# Patient Record
Sex: Female | Born: 1970 | Race: White | Hispanic: No | Marital: Single | Smoking: Former smoker
Health system: Southern US, Community
[De-identification: ages and names within clinical notes are randomized; demographics above are authoritative.]

## PROBLEM LIST (undated history)

## (undated) DIAGNOSIS — F419 Anxiety disorder, unspecified: Secondary | ICD-10-CM

## (undated) DIAGNOSIS — F32A Depression, unspecified: Secondary | ICD-10-CM

## (undated) DIAGNOSIS — Z8052 Family history of malignant neoplasm of bladder: Secondary | ICD-10-CM

## (undated) DIAGNOSIS — K219 Gastro-esophageal reflux disease without esophagitis: Secondary | ICD-10-CM

## (undated) HISTORY — DX: Depression, unspecified: F32.A

## (undated) HISTORY — DX: Family history of malignant neoplasm of bladder: Z80.52

## (undated) HISTORY — DX: Gastro-esophageal reflux disease without esophagitis: K21.9

## (undated) HISTORY — DX: Anxiety disorder, unspecified: F41.9

---

## 2000-07-16 ENCOUNTER — Other Ambulatory Visit: Admission: RE | Admit: 2000-07-16 | Discharge: 2000-07-16 | Payer: Self-pay | Admitting: Obstetrics and Gynecology

## 2001-01-08 ENCOUNTER — Inpatient Hospital Stay (HOSPITAL_COMMUNITY): Admission: AD | Admit: 2001-01-08 | Discharge: 2001-01-10 | Payer: Self-pay | Admitting: Pediatrics

## 2001-01-08 ENCOUNTER — Encounter (INDEPENDENT_AMBULATORY_CARE_PROVIDER_SITE_OTHER): Payer: Self-pay

## 2001-02-17 ENCOUNTER — Other Ambulatory Visit: Admission: RE | Admit: 2001-02-17 | Discharge: 2001-02-17 | Payer: Self-pay | Admitting: Obstetrics and Gynecology

## 2001-11-26 HISTORY — PX: TUBAL LIGATION: SHX77

## 2002-02-24 ENCOUNTER — Other Ambulatory Visit: Admission: RE | Admit: 2002-02-24 | Discharge: 2002-02-24 | Payer: Self-pay | Admitting: Obstetrics and Gynecology

## 2002-09-09 ENCOUNTER — Inpatient Hospital Stay (HOSPITAL_COMMUNITY): Admission: AD | Admit: 2002-09-09 | Discharge: 2002-09-11 | Payer: Self-pay | Admitting: Obstetrics and Gynecology

## 2002-09-09 ENCOUNTER — Encounter (INDEPENDENT_AMBULATORY_CARE_PROVIDER_SITE_OTHER): Payer: Self-pay | Admitting: *Deleted

## 2002-11-10 ENCOUNTER — Other Ambulatory Visit: Admission: RE | Admit: 2002-11-10 | Discharge: 2002-11-10 | Payer: Self-pay | Admitting: Obstetrics and Gynecology

## 2004-02-16 ENCOUNTER — Other Ambulatory Visit: Admission: RE | Admit: 2004-02-16 | Discharge: 2004-02-16 | Payer: Self-pay | Admitting: Obstetrics and Gynecology

## 2006-02-15 ENCOUNTER — Other Ambulatory Visit: Admission: RE | Admit: 2006-02-15 | Discharge: 2006-02-15 | Payer: Self-pay | Admitting: Obstetrics and Gynecology

## 2009-05-12 ENCOUNTER — Ambulatory Visit: Payer: Self-pay | Admitting: Radiology

## 2009-05-12 ENCOUNTER — Emergency Department (HOSPITAL_BASED_OUTPATIENT_CLINIC_OR_DEPARTMENT_OTHER): Admission: EM | Admit: 2009-05-12 | Discharge: 2009-05-12 | Payer: Self-pay | Admitting: Emergency Medicine

## 2010-01-13 ENCOUNTER — Ambulatory Visit: Payer: Self-pay | Admitting: Diagnostic Radiology

## 2010-01-13 ENCOUNTER — Emergency Department (HOSPITAL_BASED_OUTPATIENT_CLINIC_OR_DEPARTMENT_OTHER): Admission: EM | Admit: 2010-01-13 | Discharge: 2010-01-13 | Payer: Self-pay | Admitting: Emergency Medicine

## 2010-06-23 ENCOUNTER — Other Ambulatory Visit: Admission: RE | Admit: 2010-06-23 | Discharge: 2010-06-23 | Payer: Self-pay | Admitting: Family Medicine

## 2011-02-14 LAB — PREGNANCY, URINE: Preg Test, Ur: NEGATIVE

## 2011-04-13 NOTE — Op Note (Signed)
NAME:  Sarah [name], Sarah [name]                            ACCOUNT NO.:  [id number]   MEDICAL RECORD NO.:  [id number]                   PATIENT TYPE:  INP   LOCATION:  9133                                 FACILITY:  [facility]   PHYSICIAN:  Dineen Kid. Rana Snare, M.D.                 DATE OF BIRTH:  Jan 27, 1971   DATE OF PROCEDURE:  09/09/2002  DATE OF DISCHARGE:                                 OPERATIVE REPORT   PREOPERATIVE DIAGNOSIS:  Multiparity, desires sterility.   POSTOPERATIVE DIAGNOSIS:  Multiparity, desires sterility.   PROCEDURE:  Modified [place] bilateral tubal ligation.   SURGEON:  Dineen Kid. Rana Snare, M.D.   ANESTHESIA:  Epidural.   INDICATIONS FOR PROCEDURE:  The patient is a 40 year old G4, P2, A1, who  just delivered her third child after induction at 76 weeks' estimated  gestational age.  The pregnancy and the delivery was uncomplicated.  She and  her husband both adamantly desire sterilization and planned postpartum tubal  ligation.  Risks and benefits were discussed at length including 5 out of a  1000 failure rate.  Informed consent was obtained.   DESCRIPTION OF PROCEDURE:  After adequate analgesia, the patient was placed  in the supine position.  She was sterilely prepped and draped.  The  infraumbilical area was infiltrated with 0.25% Marcaine.  Allis clamps were  placed and a 2-cm infraumbilical skin incision was made and taken down  sharply to the fascia.  This is incised transversely.  The peritoneum was  entered sharply.  [employer] retractors were placed.  The left fallopian tube  was identified, grasped with a Babcock clamp, identified by the fimbriated  end.  The mid portion of the tube was grasped.  It was doubly ligated with 0  plain suture.  The central portion excised.  A suture of 2-0 silk was  ligated on the proximal portion of the tube and the tube was released in the  peritoneal cavity.  The right fallopian tube was identified, grasped with a  Babcock clamp, again  identified by fimbriated end.  The mid portion of the  tube was doubly ligated with two sutures of 0 plain.  Central portion  excised.  Tubal ostia is noted to be hemostatic.  The proximal section was  then ligated with 2-0 silk.  It was released back into the peritoneal  cavity.  The fascia was then closed in a running suture of 0 Vicryl.  The  skin is then closed with 3-0 Vicryl Rapide in a subcuticular fashion.  The  patient tolerated the procedure well and was stable and transferred to the  recovery room.  Sponge and instrument count was normal x3.  Estimated blood  loss was less than 10 cc.  Dineen Kid Rana Snare, M.D.    DCL/MEDQ  D:  09/09/2002  T:  09/09/2002  Job:  [id number]

## 2016-06-10 ENCOUNTER — Emergency Department (HOSPITAL_BASED_OUTPATIENT_CLINIC_OR_DEPARTMENT_OTHER)
Admission: EM | Admit: 2016-06-10 | Discharge: 2016-06-10 | Disposition: A | Discharge status: Home / Self Care | Payer: Self-pay | Attending: Emergency Medicine | Admitting: Emergency Medicine

## 2016-06-10 ENCOUNTER — Emergency Department (HOSPITAL_BASED_OUTPATIENT_CLINIC_OR_DEPARTMENT_OTHER): Payer: Self-pay

## 2016-06-10 ENCOUNTER — Encounter (HOSPITAL_BASED_OUTPATIENT_CLINIC_OR_DEPARTMENT_OTHER): Payer: Self-pay | Admitting: *Deleted

## 2016-06-10 DIAGNOSIS — W5501XA Bitten by cat, initial encounter: Secondary | ICD-10-CM | POA: Insufficient documentation

## 2016-06-10 DIAGNOSIS — F17200 Nicotine dependence, unspecified, uncomplicated: Secondary | ICD-10-CM | POA: Insufficient documentation

## 2016-06-10 DIAGNOSIS — T148XXA Other injury of unspecified body region, initial encounter: Secondary | ICD-10-CM

## 2016-06-10 DIAGNOSIS — S61235A Puncture wound without foreign body of left ring finger without damage to nail, initial encounter: Secondary | ICD-10-CM | POA: Insufficient documentation

## 2016-06-10 DIAGNOSIS — Y999 Unspecified external cause status: Secondary | ICD-10-CM | POA: Insufficient documentation

## 2016-06-10 DIAGNOSIS — Y929 Unspecified place or not applicable: Secondary | ICD-10-CM | POA: Insufficient documentation

## 2016-06-10 DIAGNOSIS — Y9389 Activity, other specified: Secondary | ICD-10-CM | POA: Insufficient documentation

## 2016-06-10 MED ORDER — AMOXICILLIN-POT CLAVULANATE 875-125 MG PO TABS: 1.0000 | ORAL_TABLET | Freq: Two times a day (BID) | ORAL | Status: DC

## 2016-06-10 MED ORDER — TETANUS-DIPHTH-ACELL PERTUSSIS 5-2.5-18.5 LF-MCG/0.5 IM SUSP
0.5000 mL | Freq: Once | INTRAMUSCULAR | Status: AC
  Administered 2016-06-10: 0.5 mL via INTRAMUSCULAR
  Filled 2016-06-10: qty 0.5

## 2016-06-10 MED ORDER — AMOXICILLIN-POT CLAVULANATE 875-125 MG PO TABS
1.0000 | ORAL_TABLET | Freq: Once | ORAL | Status: AC
  Administered 2016-06-10: 1 via ORAL
  Filled 2016-06-10: qty 1

## 2016-06-10 NOTE — Discharge Instructions (Signed)
Animal Bite Follow up with the hand doctor. Take the antibiotics as prescribed. Return to the ED with worsening pain, fever, spreading redness, unable to move finger, or any other concerns. Animal bites can range from mild to serious. An animal bite can result in a scratch on the skin, a deep open cut, a puncture of the skin, a crush injury, or tearing away of the skin or a body part. A small bite from a house pet will usually not cause serious problems. However, some animal bites can become infected or injure a bone or other tissue.  Bites from certain animals can be more dangerous because of the risk of spreading rabies, which is a serious viral infection. This risk is higher with bites from stray animals or wild animals, such as raccoons, foxes, skunks, and bats. Dogs are responsible for most animal bites. Children are bitten more often than adults. SYMPTOMS  Common symptoms of an animal bite include:   Pain.   Bleeding.   Swelling.   Bruising.  DIAGNOSIS  This condition may be diagnosed based on a physical exam and medical history. Your health care provider will examine the wound and ask for details about the animal and how the bite happened. You may also have tests, such as:   Blood tests to check for infection or to determine if surgery is needed.  X-rays to check for damage to bones or joints.  Culture test. This uses a sample of fluid from the wound to check for infection. TREATMENT  Treatment varies depending on the location and type of animal bite and your medical history. Treatment may include:   Wound care. This often includes cleaning the wound, flushing the wound with saline solution, and applying a bandage (dressing). Sometimes, the wound is left open to heal because of the high risk of infection. However, in some cases, the wound may be closed with stitches (sutures), staples, skin glue, or adhesive strips.   Antibiotic medicine.   Tetanus shot.   Rabies  treatment if the animal could have rabies.  In some cases, bites that have become infected may require IV antibiotics and surgical treatment in the hospital.  [place]  Follow instructions from your health care provider about how to take care of your wound. Make sure you:  Wash your hands with soap and water before you change your dressing. If soap and water are not available, use hand sanitizer.  Change your dressing as told by your health care provider.  Leave sutures, skin glue, or adhesive strips in place. These skin closures may need to be in place for 2 weeks or longer. If adhesive strip edges start to loosen and curl up, you may trim the loose edges. Do not remove adhesive strips completely unless your health care provider tells you to do that.  Check your wound every day for signs of infection. Watch for:   Increasing redness, swelling, or pain.   Fluid, blood, or pus.  General Instructions  Take or apply over-the-counter and prescription medicines only as told by your health care provider.   If you were prescribed an antibiotic, take or apply it as told by your health care provider. Do not stop using the antibiotic even if your condition improves.   Keep the injured area raised (elevated) above the level of your heart while you are sitting or lying down, if this is possible.   If directed, apply ice to the injured area.   Put ice in  a plastic bag.   Place a towel between your skin and the bag.   Leave the ice on for 20 minutes, 2-3 times per day.   Keep all follow-up visits as told by your health care provider. This is important.  SEEK MEDICAL CARE IF:  You have increasing redness, swelling, or pain at the site of your wound.   You have a general feeling of sickness (malaise).   You feel nauseous or you vomit.   You have pain that does not get better.  SEEK IMMEDIATE MEDICAL CARE IF:  You have a red streak  extending away from your wound.   You have fluid, blood, or pus coming from your wound.   You have a fever or chills.   You have trouble moving your injured area.   You have numbness or tingling extending beyond the wound.   This information is not intended to replace advice given to you by your health care provider. Make sure you discuss any questions you have with your health care provider.   Document Released: 07/31/2011 Document Revised: 08/03/2015 Document Reviewed: 03/30/2015 Elsevier Interactive Patient Education [employer].

## 2016-06-10 NOTE — ED Provider Notes (Signed)
CSN: [id number]     Arrival date & time 06/10/16  [id number] History   First MD Initiated Contact with Patient 06/10/16 [phone]     Chief Complaint  Patient presents with  . Animal Bite     (Consider location/radiation/quality/duration/timing/severity/associated sxs/prior Treatment) HPI Comments: Patient states she was bitten by her cat on her left ring finger while attempting to give the cat a breathing treatment. Cat Subsequently passed away from respiratory distress. Cat was 54 years old and up-to-date on immunizations. Patient does not know her last tetanus shot was. She complains of pain to her distal fourth digit. No weakness, numbness or tingling. The patient has puncture wounds to left fourth distal phalanx through the nail plate and nail bed as well as on palmar surface of distal phalanx  The history is provided by the patient.    History reviewed. No pertinent past medical history. Past Surgical History  Procedure Laterality Date  . Tubal ligation  2003   No family history on file. Social History  Substance Use Topics  . Smoking status: Current Every Day Smoker  . Smokeless tobacco: Never Used  . Alcohol Use: Yes     Comment: several times per week   OB History    No data available     Review of Systems  Constitutional: Negative for activity change, appetite change and fatigue.  HENT: Negative for congestion.   Respiratory: Negative for cough, chest tightness and shortness of breath.   Cardiovascular: Negative for chest pain.  Gastrointestinal: Negative for abdominal pain.  Genitourinary: Negative for dysuria, hematuria, vaginal bleeding and vaginal discharge.  Musculoskeletal: Positive for myalgias and arthralgias.  Skin: Positive for wound.  A complete 10 system review of systems was obtained and all systems are negative except as noted in the HPI and PMH.      Allergies  Review of patient's allergies indicates no known allergies.  Home Medications   Prior to  Admission medications   Not on File   BP 124/93 mmHg  Pulse 102  Temp(Src) 98 F (36.7 C) (Oral)  Resp 18  Ht 5\' 4"  (1.626 m)  Wt 107 lb (48.535 kg)  BMI 18.36 kg/m2  SpO2 98% Physical Exam  Constitutional: She is oriented to person, place, and time. She appears well-developed and well-nourished. No distress.  HENT:  Head: Normocephalic and atraumatic.  Mouth/Throat: Oropharynx is clear and moist. No oropharyngeal exudate.  Eyes: Conjunctivae and EOM are normal. Pupils are equal, round, and reactive to light.  Neck: Normal range of motion. Neck supple.  No meningismus.  Cardiovascular: Normal rate, regular rhythm, normal heart sounds and intact distal pulses.   No murmur heard. Pulmonary/Chest: Effort normal and breath sounds normal. No respiratory distress.  Abdominal: Soft. There is no tenderness. There is no rebound and no guarding.  Musculoskeletal: Normal range of motion. She exhibits tenderness. She exhibits no edema.  TTP L 4th distal phalanx. Puncture wound to left distal phalanx through the nail plate no significant subungual hematoma. Puncture wound through plantar surface of distal phalanx. Intact radial pulse. Intact flexion and extension of DIP and PIP joints. Intact radial pulse. Distal sensation intact.  Neurological: She is alert and oriented to person, place, and time. No cranial nerve deficit. She exhibits normal muscle tone. Coordination normal.  No ataxia on finger to nose bilaterally. No pronator drift. 5/5 strength throughout. CN 2-12 intact.Equal grip strength. Sensation intact.   Skin: Skin is warm. There is erythema.  Psychiatric: She has a normal mood  and affect. Her behavior is normal.  Nursing note and vitals reviewed.   ED Course  Procedures (including critical care time) Labs Review Labs Reviewed - No data to display  Imaging Review Dg Finger Ring Left  06/10/2016  CLINICAL DATA:  Cat bite. EXAM: LEFT RING FINGER 2+V COMPARISON:  None. FINDINGS:  There is no evidence of fracture or dislocation. There is no evidence of arthropathy or other focal bone abnormality. Soft tissues are unremarkable. IMPRESSION: Normal left fourth finger. Electronically Signed   By: Marijo Conception, M.D.   On: 06/10/2016 09:25   I have personally reviewed and evaluated these images and lab results as part of my medical decision-making.   EKG Interpretation None      MDM   Final diagnoses:  Animal bite   Cat Bite to left fourth finger. Neurovascular intact. Clean wound, update tetanus, obtain x-ray.  X-rays negative. Tetanus is updated. Wound cleaned. Patient able to flex and extend DIP and PIP joints.  Start on Augmentin. Follow-up with PCP and hand surgery. Return to ED with worsening pain, spreading redness, fevers, difficulty moving finger or any other concerns.    Ezequiel Essex, MD 06/10/16 [phone]

## 2016-06-10 NOTE — ED Notes (Signed)
Pt reports she was trying to give her cat a breathing treatment around 0130 this morning when the cat bit her. Pt has puncture wounds to L 4th finger pad and nail. Reports cat is UTD on immunizations.

## 2018-11-26 DIAGNOSIS — C801 Malignant (primary) neoplasm, unspecified: Secondary | ICD-10-CM

## 2018-11-26 HISTORY — DX: Malignant (primary) neoplasm, unspecified: C80.1

## 2018-11-26 HISTORY — PX: COLON SURGERY: SHX602

## 2019-05-28 ENCOUNTER — Emergency Department (HOSPITAL_BASED_OUTPATIENT_CLINIC_OR_DEPARTMENT_OTHER)
Admission: EM | Admit: 2019-05-28 | Discharge: 2019-05-28 | Disposition: A | Discharge status: Home / Self Care | Payer: Medicaid Other | Attending: Emergency Medicine | Admitting: Emergency Medicine

## 2019-05-28 ENCOUNTER — Encounter (HOSPITAL_BASED_OUTPATIENT_CLINIC_OR_DEPARTMENT_OTHER): Payer: Self-pay

## 2019-05-28 ENCOUNTER — Other Ambulatory Visit: Payer: Self-pay

## 2019-05-28 DIAGNOSIS — K649 Unspecified hemorrhoids: Secondary | ICD-10-CM | POA: Diagnosis not present

## 2019-05-28 DIAGNOSIS — F17210 Nicotine dependence, cigarettes, uncomplicated: Secondary | ICD-10-CM | POA: Insufficient documentation

## 2019-05-28 DIAGNOSIS — K625 Hemorrhage of anus and rectum: Secondary | ICD-10-CM | POA: Insufficient documentation

## 2019-05-28 LAB — CBC WITH DIFFERENTIAL/PLATELET
Abs Immature Granulocytes: 0.01 10*3/uL (ref 0.00–0.07)
Basophils Absolute: 0.1 10*3/uL (ref 0.0–0.1)
Basophils Relative: 1 %
Eosinophils Absolute: 0.3 10*3/uL (ref 0.0–0.5)
Eosinophils Relative: 4 %
HCT: 45.1 % (ref 36.0–46.0)
Hemoglobin: 14.9 g/dL (ref 12.0–15.0)
Immature Granulocytes: 0 %
Lymphocytes Relative: 35 %
Lymphs Abs: 2.4 10*3/uL (ref 0.7–4.0)
MCH: 31 pg (ref 26.0–34.0)
MCHC: 33 g/dL (ref 30.0–36.0)
MCV: 93.8 fL (ref 80.0–100.0)
Monocytes Absolute: 0.5 10*3/uL (ref 0.1–1.0)
Monocytes Relative: 8 %
Neutro Abs: 3.6 10*3/uL (ref 1.7–7.7)
Neutrophils Relative %: 52 %
Platelets: 260 10*3/uL (ref 150–400)
RBC: 4.81 MIL/uL (ref 3.87–5.11)
RDW: 12 % (ref 11.5–15.5)
WBC: 6.9 10*3/uL (ref 4.0–10.5)
nRBC: 0 % (ref 0.0–0.2)

## 2019-05-28 LAB — COMPREHENSIVE METABOLIC PANEL
ALT: 18 U/L (ref 0–44)
AST: 25 U/L (ref 15–41)
Albumin: 4.1 g/dL (ref 3.5–5.0)
Alkaline Phosphatase: 44 U/L (ref 38–126)
Anion gap: 11 (ref 5–15)
BUN: 16 mg/dL (ref 6–20)
CO2: 24 mmol/L (ref 22–32)
Calcium: 9.2 mg/dL (ref 8.9–10.3)
Chloride: 104 mmol/L (ref 98–111)
Creatinine, Ser: 0.64 mg/dL (ref 0.44–1.00)
GFR calc Af Amer: >60 mL/min (ref >60)
GFR calc non Af Amer: >60 mL/min (ref >60)
Glucose, Bld: 88 mg/dL (ref 70–99)
Potassium: 3.9 mmol/L (ref 3.5–5.1)
Sodium: 139 mmol/L (ref 135–145)
Total Bilirubin: 0.6 mg/dL (ref 0.3–1.2)
Total Protein: 7.1 g/dL (ref 6.5–8.1)

## 2019-05-28 LAB — OCCULT BLOOD X 1 CARD TO LAB, STOOL: Fecal Occult Bld: POSITIVE — AB

## 2019-05-28 NOTE — ED Provider Notes (Signed)
[facility] EMERGENCY DEPARTMENT Provider Note   CSN: [id number] Arrival date & time: 05/28/19  1454    History   Chief Complaint Chief Complaint  Patient presents with  . Rectal Bleeding    HPI Sarah [name] is a 48 y.o. female.     HPI  48 year old female presents with rectal bleeding.  This is been ongoing since about February.  Occurs basically every time she has a bowel movement.  Her bowel movements have been softer than typical and she feels an urge to go often.  No vomiting.  She is not sure if there is blood in the stool but she typically does not notice blood in the toilet and is mostly when wiping.  No rectal pain.  No abdominal pain but before she go she will typically feel bloated.  She is lost about 5 pounds over these 5 months. No blood thinners.  History reviewed. No pertinent past medical history.  There are no active problems to display for this patient.   Past Surgical History:  Procedure Laterality Date  . TUBAL LIGATION  2003     OB History   No obstetric history on file.      Home Medications    Prior to Admission medications   Medication Sig Start Date End Date Taking? Authorizing Provider  amoxicillin-clavulanate (AUGMENTIN) 875-125 MG tablet Take 1 tablet by mouth every 12 (twelve) hours. 06/10/16   Ezequiel Essex, MD    Family History No family history on file.  Social History Social History   Tobacco Use  . Smoking status: Current Every Day Smoker    Types: Cigarettes  . Smokeless tobacco: Never Used  Substance Use Topics  . Alcohol use: Yes    Comment: weekly  . Drug use: No     Allergies   Patient has no known allergies.   Review of Systems Review of Systems  Constitutional: Positive for unexpected weight change. Negative for fever.  Gastrointestinal: Positive for abdominal distention and blood in stool. Negative for abdominal pain, diarrhea, rectal pain and vomiting.  Neurological: Negative for  light-headedness.  All other systems reviewed and are negative.    Physical Exam Updated Vital Signs BP 138/85 (BP Location: Right Arm)   Pulse 99   Temp 98.9 F (37.2 C) (Oral)   Resp 18   Ht 5\' 3"  (1.6 m)   Wt 44.1 kg   SpO2 100%   BMI 17.24 kg/m   Physical Exam Vitals signs and nursing note reviewed. Exam conducted with a chaperone present.  Constitutional:      General: She is not in acute distress.    Appearance: She is well-developed. She is not ill-appearing or diaphoretic.  HENT:     Head: Normocephalic and atraumatic.     Right Ear: External ear normal.     Left Ear: External ear normal.     Nose: Nose normal.  Eyes:     General:        Right eye: No discharge.        Left eye: No discharge.  Cardiovascular:     Rate and Rhythm: Normal rate and regular rhythm.     Heart sounds: Normal heart sounds.  Pulmonary:     Effort: Pulmonary effort is normal.     Breath sounds: Normal breath sounds.  Abdominal:     Palpations: Abdomen is soft.     Tenderness: There is no abdominal tenderness.  Genitourinary:    Rectum: Guaiac result positive (dark  red on DRE). External hemorrhoid (nonthrombosed) present.  Skin:    General: Skin is warm and dry.  Neurological:     Mental Status: She is alert.  Psychiatric:        Mood and Affect: Mood is not anxious.      ED Treatments / Results  Labs (all labs ordered are listed, but only abnormal results are displayed) Labs Reviewed  OCCULT BLOOD X 1 CARD TO LAB, STOOL - Abnormal; Notable for the following components:      Result Value   Fecal Occult Bld POSITIVE (*)    All other components within normal limits  COMPREHENSIVE METABOLIC PANEL  CBC WITH DIFFERENTIAL/PLATELET    EKG None  Radiology No results found.  Procedures Procedures (including critical care time)  Medications Ordered in ED Medications - No data to display   Initial Impression / Assessment and Plan / ED Course  I have reviewed the  triage vital signs and the nursing notes.  Pertinent labs & imaging results that were available during my care of the patient were reviewed by me and considered in my medical decision making (see chart for details).        Patient has hemorrhoids and is heme positive but her vitals are benign, labs are all overall reassuring.  I think she definitely needs a colonoscopy and GI referral but I do not think she needs a CT abdomen pelvis at this point.  I discussed this with her and she agrees.  Otherwise we will have her follow-up with GI and I discussed return precautions.  Final Clinical Impressions(s) / ED Diagnoses   Final diagnoses:  Rectal bleeding    ED Discharge Orders    None       Sherwood Gambler, MD 05/28/19 [phone]

## 2019-05-28 NOTE — ED Triage Notes (Signed)
Pt c/o blood in stools x "few months"-has not sought medical care-NAD-steady gait

## 2019-06-09 ENCOUNTER — Encounter: Payer: Self-pay | Admitting: Gastroenterology

## 2019-06-09 ENCOUNTER — Other Ambulatory Visit: Payer: Self-pay

## 2019-06-09 ENCOUNTER — Telehealth (INDEPENDENT_AMBULATORY_CARE_PROVIDER_SITE_OTHER): Payer: Self-pay | Admitting: Gastroenterology

## 2019-06-09 VITALS — Ht 62.0 in | Wt 97.0 lb

## 2019-06-09 DIAGNOSIS — R194 Change in bowel habit: Secondary | ICD-10-CM

## 2019-06-09 DIAGNOSIS — K219 Gastro-esophageal reflux disease without esophagitis: Secondary | ICD-10-CM

## 2019-06-09 DIAGNOSIS — Z1211 Encounter for screening for malignant neoplasm of colon: Secondary | ICD-10-CM

## 2019-06-09 DIAGNOSIS — R634 Abnormal weight loss: Secondary | ICD-10-CM

## 2019-06-09 DIAGNOSIS — K921 Melena: Secondary | ICD-10-CM

## 2019-06-09 DIAGNOSIS — R195 Other fecal abnormalities: Secondary | ICD-10-CM

## 2019-06-09 MED ORDER — NA SULFATE-K SULFATE-MG SULF 17.5-3.13-1.6 GM/177ML PO SOLN: 1.0000 | Freq: Once | ORAL | 0 refills | Status: AC

## 2019-06-09 NOTE — Progress Notes (Signed)
Chief Complaint: Hematochezia, change in bowel habits, GERD  Referring Provider:     Sherwood Gambler, MD ([facility])   HPI:    Due to current restrictions/limitations of in-office visits due to the COVID-19 pandemic, this scheduled clinical appointment was converted to a telehealth virtual consultation using Doximity.  -Time of medical discussion: 22 minutes -The patient did consent to this virtual visit and is aware of possible charges through their insurance for this visit.  -Names of all parties present: Sarah [name] (patient), Gerrit Heck, DO, [facility] (physician) -Patient location: Home -Physician location: Office  Sarah [name] is a 48 y.o. female referred to the Gastroenterology Clinic for evaluation of hematochezia.  Symptoms have been present since February, with BRBPR with every bowel movement. States she had an issue with hemorrhoids in the past after birth of her 3 children. But now also with changes in bowel habits, with irregular stools- can go up to 3 days without BM, then small soft stools and feeling of incomplete evacuation. +bloating. No straining to have BM though. No n/v/f/c, night sweats. Baseline was 1 soft stool/day, no straining. +post prandial urgency, but may not always have a BM with that urgency, so she is now skipping breakfast/lunch to avoid those sxs, particularly at work. Has lost 7# due to skipping meals. Has not trialed any stool softeners, fiber supplement, etc. No prior colonoscopy.   Was seen in the ER for the symptoms on 05/28/2019.  External hemorrhoid noted on DRE.  FOBT positive stool, otherwise normal CMP and CBC.  Separately, has a longstanding hx of GERD, characterized HB, regurgitation. Occurs 1-2 times/week. Worse with spicy foods. Does not take any medications aside from the occasional Tums. No dysphagia. No prior EGD.  Family history notable for mother and sister with hiatal hernia and GERD.  FHx notable for father w/ polyps.  Otherwise, no FHx of CRC, GI malignancy, IBD.   Past medical history, past surgical history, social history, family history, medications, and allergies reviewed in the chart and with patient.    History reviewed. No pertinent past medical history.   Past Surgical History:  Procedure Laterality Date  . TUBAL LIGATION  2003   Family History  Problem Relation Age of Onset  . Colon cancer Neg Hx    Social History   Tobacco Use  . Smoking status: Current Every Day Smoker    Packs/day: 1.00    Types: Cigarettes  . Smokeless tobacco: Never Used  Substance Use Topics  . Alcohol use: Yes    Alcohol/week: 15.0 standard drinks    Types: 15 Standard drinks or equivalent per week    Comment: weekly  . Drug use: No   No current outpatient medications on file.   No current facility-administered medications for this visit.    No Known Allergies   Review of Systems: All systems reviewed and negative except where noted in HPI.     Physical Exam:    Complete physical exam not completed due to the nature of this telehealth communication.   Gen: Awake, alert, and oriented, and well communicative. HEENT: EOMI, non-icteric sclera, NCAT, MMM Neck: Normal movement of head and neck Pulm: No labored breathing, speaking in full sentences without conversational dyspnea Derm: No apparent lesions or bruising in visible field MS: Moves all visible extremities without noticeable abnormality Psych: Pleasant, cooperative, normal speech, thought processing seemingly intact   ASSESSMENT AND PLAN;   1)  Hematochezia/FOBT positive stool 2) Change in bowel habits - Colonoscopy with random and directed biopsies to evaluate for IBD, microscopic colitis, etc. -If only notable for hemorrhoids, can consider hemorrhoid band ligation - Increase dietary fiber with consideration for fiber supplement -Depending on results of colonoscopy, can also consider short course of MiraLAX then increased fiber to  maintain regular stools  3) GERD: -EGD to evaluate for erosive esophagitis, LES laxity, hiatal hernia.  Given age, family history, duration of reflux, heightened patient concerns, plan for Barrett's esophagus screening - Resume avoidance of inciting foods -Discussed trial of PPI therapy, which she elected to hold off pending endoscopic results  4) Weight loss: -Secondary to food avoidance so to avoid postprandial urgency - EGD and colonoscopy as above - Duodenal biopsies -Can hold off on additional micronutrient assessment at this juncture  5) CRC screening: -Due for age-appropriate CRC screening, as she turned 48 today.  Plan for colonoscopy as above  The indications, risks, and benefits of EGD and colonoscopy were explained to the patient in detail. Risks include but are not limited to bleeding, perforation, adverse reaction to medications, and cardiopulmonary compromise. Sequelae include but are not limited to the possibility of surgery, hositalization, and mortality. The patient verbalized understanding and wished to proceed. All questions answered, referred to scheduler and bowel prep ordered. Further recommendations pending results of the exam.     Lavena Bullion, DO, FACG  06/09/2019, 3:59 PM   Rozetta Nunnery, Cleone Slim., MD

## 2019-06-09 NOTE — Patient Instructions (Addendum)
If you are age 50 or older, your body mass index should be between 23-30. Your Body mass index is 17.74 kg/m. If this is out of the aforementioned range listed, please consider follow up with your Primary Care Provider.  If you are age 42 or younger, your body mass index should be between 19-25. Your Body mass index is 17.74 kg/m. If this is out of the aformentioned range listed, please consider follow up with your Primary Care Provider.   To help prevent the possible spread of infection to our patients, communities, and staff; we will be implementing the following measures:  As of now we are not allowing any visitors/family members to accompany you to any upcoming appointments with [facility]. If you have any concerns about this please contact our office to discuss prior to the appointment.   We have sent the following medications to your pharmacy for you to pick up at your convenience: [place] have been scheduled for an endoscopy and colonoscopy. Please follow the written instructions given to you at your visit today. Please pick up your prep supplies at the pharmacy within the next 1-3 days. If you use inhalers (even only as needed), please bring them with you on the day of your procedure. Your physician has requested that you go to www.startemmi.com and enter the access code given to you at your visit today. This web site gives a general overview about your procedure. However, you should still follow specific instructions given to you by our office regarding your preparation for the procedure.  Please call our office at [phone] to set up your 3-6 month follow up visit.  It was a pleasure to see you today!  Vito Cirigliano, D.O.

## 2019-06-18 ENCOUNTER — Telehealth: Payer: Self-pay | Admitting: Gastroenterology

## 2019-06-18 ENCOUNTER — Other Ambulatory Visit: Payer: Self-pay

## 2019-06-18 MED ORDER — NA SULFATE-K SULFATE-MG SULF 17.5-3.13-1.6 GM/177ML PO SOLN: 1.0000 | Freq: Once | ORAL | 0 refills | Status: AC

## 2019-06-18 NOTE — Progress Notes (Signed)
Prep for Colonoscopy

## 2019-06-24 ENCOUNTER — Ambulatory Visit: Payer: Self-pay | Admitting: Gastroenterology

## 2019-06-24 ENCOUNTER — Telehealth: Payer: Self-pay | Admitting: Gastroenterology

## 2019-06-24 NOTE — Telephone Encounter (Signed)
Pt responded "no" to all screening questions °

## 2019-06-24 NOTE — Telephone Encounter (Signed)

## 2019-06-25 ENCOUNTER — Other Ambulatory Visit: Payer: Self-pay

## 2019-06-25 ENCOUNTER — Telehealth: Payer: Self-pay | Admitting: Hematology

## 2019-06-25 ENCOUNTER — Ambulatory Visit (AMBULATORY_SURGERY_CENTER): Payer: Self-pay | Admitting: Gastroenterology

## 2019-06-25 ENCOUNTER — Other Ambulatory Visit: Payer: Self-pay | Admitting: *Deleted

## 2019-06-25 ENCOUNTER — Other Ambulatory Visit: Payer: Self-pay | Admitting: Hematology

## 2019-06-25 ENCOUNTER — Encounter: Payer: Self-pay | Admitting: Gastroenterology

## 2019-06-25 VITALS — BP 124/94 | HR 85 | Temp 98.4°F | Resp 13 | Ht 62.0 in | Wt 97.0 lb

## 2019-06-25 DIAGNOSIS — K2100 Gastro-esophageal reflux disease with esophagitis, without bleeding: Secondary | ICD-10-CM

## 2019-06-25 DIAGNOSIS — K641 Second degree hemorrhoids: Secondary | ICD-10-CM

## 2019-06-25 DIAGNOSIS — K6389 Other specified diseases of intestine: Secondary | ICD-10-CM

## 2019-06-25 DIAGNOSIS — K6289 Other specified diseases of anus and rectum: Secondary | ICD-10-CM

## 2019-06-25 DIAGNOSIS — B9681 Helicobacter pylori [H. pylori] as the cause of diseases classified elsewhere: Secondary | ICD-10-CM

## 2019-06-25 DIAGNOSIS — K2990 Gastroduodenitis, unspecified, without bleeding: Secondary | ICD-10-CM

## 2019-06-25 DIAGNOSIS — K921 Melena: Secondary | ICD-10-CM

## 2019-06-25 DIAGNOSIS — C20 Malignant neoplasm of rectum: Secondary | ICD-10-CM | POA: Insufficient documentation

## 2019-06-25 DIAGNOSIS — C187 Malignant neoplasm of sigmoid colon: Secondary | ICD-10-CM

## 2019-06-25 DIAGNOSIS — K219 Gastro-esophageal reflux disease without esophagitis: Secondary | ICD-10-CM

## 2019-06-25 DIAGNOSIS — K635 Polyp of colon: Secondary | ICD-10-CM

## 2019-06-25 DIAGNOSIS — K2970 Gastritis, unspecified, without bleeding: Secondary | ICD-10-CM

## 2019-06-25 HISTORY — PX: COLONOSCOPY: SHX174

## 2019-06-25 MED ORDER — SODIUM CHLORIDE 0.9 % IV SOLN: 500.0000 mL | Freq: Once | INTRAVENOUS | Status: DC

## 2019-06-25 MED ORDER — OMEPRAZOLE 20 MG PO CPDR: 20.0000 mg | DELAYED_RELEASE_CAPSULE | Freq: Two times a day (BID) | ORAL | 1 refills | Status: DC

## 2019-06-25 NOTE — Progress Notes (Signed)
Patient discharged with 2 bottles of contrast and instructions for CT scan. Patient taken to lab for CEA prior to discharge from [facility].

## 2019-06-25 NOTE — Progress Notes (Signed)
Referral for colorectal surgeon and oncologist have been sent via fax to [facility] and Dr. Faythe Ghee; Awaiting response for appt;

## 2019-06-25 NOTE — Telephone Encounter (Signed)
Called and LMVm for patient to call back to scheduled NEW PATIENT appt w/ Dr Maylon Peppers per 7/30 staff message.

## 2019-06-25 NOTE — Progress Notes (Signed)
**Sarah Sarah** Sarah Sarah  Patient Care Team: Karleen Hampshire., MD as PCP - General (Internal Medicine)  HEME/ONC OVERVIEW: 1. 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 Bryan Lemma of GI for evaluation of hematochezia after having had urgency with bowel movement and hematochezia since 11/2018. She underwent colonoscopy in late 05/2019, which showed a fungating, partially obstructing mass in the rectosigmoid colon, approximately 13 to 19 cm from the anal verge.  The mass was biopsied, the results of which are pending.  She does not have any family hx of GI malignancies in the immediate family members, but does have several maternal aunts who had cancer (types unknown).  -I reviewed the colonoscopy results in detail with the patient -I also reviewed the NCCN guidelines in detail with the patient -Given the suspected colon cancer, CT chest, abdomen/pelvis have been ordered to complete the staging, currently scheduled on 06/30/2019  -I have also requested MSI testing on the colon biopsy, assuming that the biopsy confirms colon cancer -Finally, she is scheduled with [facility] on 07/07/2019 to discuss the feasibility of surgical resection  -Pending the CT CAP results and surgery evaluation, we will determine the next steps  Tobacco abuse -Patient currently smokes approximately 1 pack/day for the past 35 years -I spent some time counseling the patient the importance of tobacco cessation, especially if surgery is being considered, as tobacco use impairs wound healing -We discussed common strategies including nicotine patches, Tobacco Quit-line, and other  nicotine replacement products to assist in the patient's effort to quit. -The patient is interested in quitting smoking but declined pharmacologic intervention  EtOH abuse -Patient currently drinks 4 beers approximately 3 nights a week -I counseled the patient on the importance of alcohol moderation, as it would adversely affect her nutrition and therefore wound healing -Patient expressed understanding, and agreed with the plan  A total of more than 60 minutes were spent face-to-face with the patient during this encounter and over half of that time was spent on counseling and coordination of care as outlined above.  All questions were answered. The patient knows to call the clinic with any problems, questions or concerns.  Return in 2 weeks to follow up imaging and pathology results.   Tish Men, MD 06/26/2019 11:51 AM   CHIEF COMPLAINTS/PURPOSE OF CONSULTATION:  "I still have blood in my stool"  HISTORY OF PRESENTING ILLNESS:  Sarah Sarah 48 y.o. 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  Patient reports that she has had bright red blood per rectum with every bowel movement since January, 2020, associated with lower abdominal discomfort, bowel movement urgency and sensation of incomplete emptying.  She initially thought it was caused by hemorrhoids, and due to the COVID outbreak, she decided to wait until the pandemic got better, but over time, the hematochezia became worse, prompting her to go to the ER in early 05/2019 for further evaluation. Her FOBT was positive, but her CBC was normal. She was referred to Dr. Bryan Lemma, who performed EGD and colonoscopy, the latter of which showed a large rectosigmoid mass. The pathology from the bx is pending.   Patient reports smoking < 1ppd x 35 years, and also drinks 3-4 beers approximately  three times a week.  She denies any illicit drug use.  She works as a Haematologist.  She denies any complaint today.  I  have reviewed her chart and materials related to her cancer extensively and collaborated history with the patient. Summary of oncologic history is as follows: Oncology History  Colon cancer ([place])  06/25/2019 Initial Diagnosis   Colon cancer ([place])   06/25/2019 Procedure   Colonoscopy: - Hemorrhoids found on perianal exam. - One 9 mm polyp in the sigmoid colon, removed with a cold snare. Resected and retrieved. - Malignant partially obstructing tumor in the recto-sigmoid colon. Biopsied. Tattooed. - Non-bleeding internal hemorrhoids. - The descending colon and distal transverse colon are normal.     MEDICAL HISTORY:  History reviewed. No pertinent past medical history.  SURGICAL HISTORY: Past Surgical History:  Procedure Laterality Date  . TUBAL LIGATION  2003    SOCIAL HISTORY: Social History   Socioeconomic History  . Marital status: Single    Spouse name: Not on file  . Number of children: Not on file  . Years of education: Not on file  . Highest education level: Not on file  Occupational History  . Not on file  Social Needs  . Financial resource strain: Not on file  . Food insecurity    Worry: Not on file    Inability: Not on file  . Transportation needs    Medical: Not on file    Non-medical: Not on file  Tobacco Use  . Smoking status: Current Every Day Smoker    Packs/day: 1.00    Types: Cigarettes  . Smokeless tobacco: Never Used  Substance and Sexual Activity  . Alcohol use: Yes    Alcohol/week: 15.0 standard drinks    Types: 15 Standard drinks or equivalent per week    Comment: weekly  . Drug use: No  . Sexual activity: Not on file  Lifestyle  . Physical activity    Days per week: Not on file    Minutes per session: Not on file  . Stress: Not on file  Relationships  . Social Herbalist on phone: Not on file    Gets together: Not on file    Attends religious service: Not on file    Active member of club or organization: Not on file     Attends meetings of clubs or organizations: Not on file    Relationship status: Not on file  . Intimate partner violence    Fear of current or ex partner: Not on file    Emotionally abused: Not on file    Physically abused: Not on file    Forced sexual activity: Not on file  Other Topics Concern  . Not on file  Social History Narrative  . Not on file    FAMILY HISTORY: Family History  Problem Relation Age of Onset  . Colon cancer Neg Hx   . Esophageal cancer Neg Hx   . Rectal cancer Neg Hx   . Stomach cancer Neg Hx     ALLERGIES:  has No Known Allergies.  MEDICATIONS:  Current Outpatient Medications  Medication Sig Dispense Refill  . omeprazole (PRILOSEC) 20 MG capsule Take 1 capsule (20 mg total) by mouth 2 (two) times daily before a meal. (Patient not taking: Reported on 06/26/2019) 180 capsule 1   No current facility-administered medications for this visit.     REVIEW OF SYSTEMS:   Constitutional: ( - ) fevers, ( - )  chills , ( - )  night sweats Eyes: ( - ) blurriness of vision, ( - ) double vision, ( - ) watery eyes Ears, nose, mouth, throat, and face: ( - ) mucositis, ( - ) sore throat Respiratory: ( - ) cough, ( - ) dyspnea, ( - ) wheezes Cardiovascular: ( - ) palpitation, ( - ) chest discomfort, ( - ) lower extremity swelling Gastrointestinal:  ( - ) nausea, ( - ) heartburn, ( - ) change in bowel habits Skin: ( - ) abnormal skin rashes Lymphatics: ( - ) new lymphadenopathy, ( - ) easy bruising Neurological: ( - ) numbness, ( - ) tingling, ( - ) new weaknesses Behavioral/Psych: ( - ) mood change, ( - ) new changes  All other systems were reviewed with the patient and are negative.  PHYSICAL EXAMINATION: ECOG PERFORMANCE STATUS: 1 - Symptomatic but completely ambulatory  Vitals:   06/26/19 1102  BP: (!) 121/95  Pulse: 66  Resp: 18  Temp: (!) 97.5 F (36.4 C)  SpO2: 100%   Filed Weights   06/26/19 1102  Weight: 97 lb 8 oz (44.2 kg)    GENERAL: alert,  no distress and comfortable, thin SKIN: skin color, texture, turgor are normal, no rashes or significant lesions EYES: conjunctiva are pink and non-injected, sclera clear OROPHARYNX: no exudate, no erythema; lips, buccal mucosa, and tongue normal  NECK: supple, non-tender LYMPH:  no palpable lymphadenopathy in the cervical LUNGS: clear to auscultation with normal breathing effort HEART: regular rate & rhythm, no murmurs, no lower extremity edema ABDOMEN: soft, non-tender, non-distended, normal bowel sounds Musculoskeletal: no cyanosis of digits and no clubbing  PSYCH: alert & oriented x 3, fluent speech NEURO: no focal motor/sensory deficits  LABORATORY DATA:  I have reviewed the data as listed Lab Results  Component Value Date   WBC 6.7 06/26/2019   HGB 14.1 06/26/2019   HCT 43.1 06/26/2019   MCV 94.1 06/26/2019   PLT 242 06/26/2019   Lab Results  Component Value Date   NA 139 06/26/2019   K 3.5 06/26/2019   CL 104 06/26/2019   CO2 27 06/26/2019   PATHOLOGY: I have reviewed the pathology reports as documented in the oncologist history.

## 2019-06-25 NOTE — Op Note (Signed)
[place] Patient Name: Sarah [name] Procedure Date: 06/25/2019 7:58 AM MRN: [id number] Endoscopist: Gerrit Heck , MD Age: 48 Referring MD:  Date of Birth: Apr 18, 1971 Gender: Female Account #: [id number] Procedure:                Colonoscopy Indications:              Hematochezia, Heme positive stool, Change in bowel                            habits, Weight loss                           48 yo female with recent onset hematochezia and                            change in bowel habits, described as alternating                            between constipation and fecal urgency/soft stools.                            Additionally with abdominal bloating. Has lost                            approximately 7#. No prior colonoscopy. Medicines:                Monitored Anesthesia Care Procedure:                Pre-Anesthesia Assessment:                           - Prior to the procedure, a History and Physical                            was performed, and patient medications and                            allergies were reviewed. The patient's tolerance of                            previous anesthesia was also reviewed. The risks                            and benefits of the procedure and the sedation                            options and risks were discussed with the patient.                            All questions were answered, and informed consent                            was obtained. Prior Anticoagulants: The patient has  taken no previous anticoagulant or antiplatelet                            agents. ASA Grade Assessment: II - A patient with                            mild systemic disease. After reviewing the risks                            and benefits, the patient was deemed in                            satisfactory condition to undergo the procedure.                           After obtaining informed consent, the colonoscope                       was passed under direct vision. Throughout the                            procedure, the patient's blood pressure, pulse, and                            oxygen saturations were monitored continuously. The                            Colonoscope was introduced through the anus, but                            unabel to advance through the rectosigmoid mass.                            Exchanged for a smaller caliber upper endoscope and                            able to advance through the mass and advanced to                            the the distal transverse colon. The colonoscopy                            was otherwise performed without difficulty. The                            patient tolerated the procedure well. The quality                            of the bowel preparation was adequate. The rectum                            was photographed. Scope In: 8:09:10 AM Scope Out: 8:42:14 AM Scope Withdrawal Time: 0 hours 21 minutes 34 seconds  Total Procedure Duration: 0 hours 33 minutes  4 seconds  Findings:                 Hemorrhoids were found on perianal exam.                           A 9 mm polyp was found in the sigmoid colon. The                            polyp was sessile. The polyp was removed with a                            cold snare. Resection and retrieval were complete.                            Estimated blood loss was minimal.                           A frond-like/villous, fungating and ulcerated                            partially obstructing large mass was found in the                            recto-sigmoid colon. The mass was circumferential.                            The mass measured six cm in length, located 13-19                            cm from the anal verge. Oozing was present. This                            was biopsied with a cold forceps for histology.                            Area 6 cm proximal to the mass and 3 cm distal to                             the mass was tattooed with an injection of a total                            of 5 mL of [place] ink. Estimated blood loss was                            minimal.                           Non-bleeding internal hemorrhoids were found during                            retroflexion. The hemorrhoids were small.  The descending colon and distal transverse colon                            appeared normal. Unable to advance the colonoscope                            through the rectosigmoid mass. Exchanged for a                            smaller caliber upper endoscope and able to advance                            through the mass. Due to looping and a shorter                            scope, unable to reach the cecum. External                            abdominal pressure applied. Extent reached was the                            distal transverse colon. Complications:            No immediate complications. Estimated Blood Loss:     Estimated blood loss was minimal. Impression:               - Hemorrhoids found on perianal exam.                           - One 9 mm polyp in the sigmoid colon, removed with                            a cold snare. Resected and retrieved.                           - Malignant partially obstructing tumor in the                            recto-sigmoid colon. Biopsied. Tattooed.                           - Non-bleeding internal hemorrhoids.                           - The descending colon and distal transverse colon                            are normal. Recommendation:           - Patient has a contact number available for                            emergencies. The signs and symptoms of potential                            delayed  complications were discussed with the                            patient. Return to normal activities tomorrow.                            Written discharge instructions were provided  to the                            patient.                           - Resume previous diet today.                           - Continue present medications.                           - Await pathology results.                           - Perform a CT scan (computed tomography) of chest                            with contrast, abdomen with contrast and pelvis                            with contrast at the next available appointment.                           - Check CEA at the next available appointment.                           - Refer to a colo-rectal surgeon at the next                            available appointment.                           - Refer to an oncologist at appointment to be                            scheduled.                           - Follow-up with Dr. Bryan Lemma in the [facility] in                            1-2 weeks. Gerrit Heck, MD 06/25/2019 9:08:20 AM

## 2019-06-25 NOTE — Op Note (Signed)
[place] Patient Name: Sarah [name] Procedure Date: 06/25/2019 7:58 AM MRN: [id number] Endoscopist: Gerrit Heck , MD Age: 48 Referring MD:  Date of Birth: August 12, 1971 Gender: Female Account #: [id number] Procedure:                Upper GI endoscopy Indications:              Suspected esophageal reflux, Hematochezia,                            Abdominal bloating, Weight loss Medicines:                Monitored Anesthesia Care Procedure:                Pre-Anesthesia Assessment:                           - Prior to the procedure, a History and Physical                            was performed, and patient medications and                            allergies were reviewed. The patient's tolerance of                            previous anesthesia was also reviewed. The risks                            and benefits of the procedure and the sedation                            options and risks were discussed with the patient.                            All questions were answered, and informed consent                            was obtained. Prior Anticoagulants: The patient has                            taken no previous anticoagulant or antiplatelet                            agents. ASA Grade Assessment: II - A patient with                            mild systemic disease. After reviewing the risks                            and benefits, the patient was deemed in                            satisfactory condition to undergo the procedure.  After obtaining informed consent, the endoscope was                            passed under direct vision. Throughout the                            procedure, the patient's blood pressure, pulse, and                            oxygen saturations were monitored continuously. The                            Endoscope was introduced through the mouth, and                            advanced to the second part of  duodenum. The upper                            GI endoscopy was accomplished without difficulty.                            The patient tolerated the procedure well. Scope In: Scope Out: Findings:                 LA Grade A (one or more mucosal breaks less than 5                            mm, not extending between tops of 2 mucosal folds)                            esophagitis with no bleeding was found 40 cm from                            the incisors.                           A non-bleeding diverticulum with a large opening                            and no stigmata of recent bleeding was found in the                            middle third of the esophagus.                           Esophagogastric landmarks were identified: the                            Z-line was found at 40 cm, the gastroesophageal                            junction was found at 40 cm and the site of hiatal  narrowing was found at 40 cm from the incisors.                           Diffuse mild inflammation characterized by erythema                            was found in the gastric fundus, in the gastric                            body and in the gastric antrum. Biopsies were taken                            with a cold forceps for Helicobacter pylori                            testing. Estimated blood loss was minimal.                           The duodenal bulb, first portion of the duodenum                            and second portion of the duodenum were normal.                            Biopsies for histology were taken with a cold                            forceps for evaluation of celiac disease. Estimated                            blood loss was minimal. Complications:            No immediate complications. Estimated Blood Loss:     Estimated blood loss was minimal. Impression:               - LA Grade A reflux esophagitis.                           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                           - Esophagogastric landmarks identified.                           - Gastritis. Biopsied.                           - Normal duodenal bulb, first portion of the                            duodenum and second portion of the duodenum.                            Biopsied. Recommendation:           -  Patient has a contact number available for                            emergencies. The signs and symptoms of potential                            delayed complications were discussed with the                            patient. Return to normal activities tomorrow.                            Written discharge instructions were provided to the                            patient.                           - Resume previous diet today.                           - Continue present medications.                           - Await pathology results.                           - Use Prilosec (omeprazole) 20 mg PO BID for 6                            weeks.                           - Perform a colonoscopy today. Gerrit Heck, MD 06/25/2019 8:50:38 AM

## 2019-06-25 NOTE — Progress Notes (Signed)
Called to room to assist during endoscopic procedure.  Patient ID and intended procedure confirmed with present staff. Received instructions for my participation in the procedure from the performing physician.  

## 2019-06-25 NOTE — Patient Instructions (Signed)
Please read Handouts provided. Begin Omeprazole 20 mg twice daily for 6 weeks. Await pathology results. Continue present medications. Follow-up with Dr. Bryan Lemma in the [facility] in 1-2 weeks. Refer to a colo-rectal surgeon at the next available appointment. Refer to an oncologist at appointment to be scheduled.     YOU HAD AN ENDOSCOPIC PROCEDURE TODAY AT [facility]:   Refer to the procedure report that was given to you for any specific questions about what was found during the examination.  If the procedure report does not answer your questions, please call your gastroenterologist to clarify.  If you requested that your care partner not be given the details of your procedure findings, then the procedure report has been included in a sealed envelope for you to review at your convenience later.  YOU SHOULD EXPECT: Some feelings of bloating in the abdomen. Passage of more gas than usual.  Walking can help get rid of the air that was put into your GI tract during the procedure and reduce the bloating. If you had a lower endoscopy (such as a colonoscopy or flexible sigmoidoscopy) you may notice spotting of blood in your stool or on the toilet paper. If you underwent a bowel prep for your procedure, you may not have a normal bowel movement for a few days.  Please Note:  You might notice some irritation and congestion in your nose or some drainage.  This is from the oxygen used during your procedure.  There is no need for concern and it should clear up in a day or so.  SYMPTOMS TO REPORT IMMEDIATELY:   Following lower endoscopy (colonoscopy or flexible sigmoidoscopy):  Excessive amounts of blood in the stool  Significant tenderness or worsening of abdominal pains  Swelling of the abdomen that is new, acute  Fever of 100F or higher   Following upper endoscopy (EGD)  Vomiting of blood or coffee ground material  New chest pain or pain under the shoulder blades  Painful or  persistently difficult swallowing  New shortness of breath  Fever of 100F or higher  Black, tarry-looking stools  For urgent or emergent issues, a gastroenterologist can be reached at any hour by calling [phone].   DIET:  We do recommend a small meal at first, but then you may proceed to your regular diet.  Drink plenty of fluids but you should avoid alcoholic beverages for 24 hours.  ACTIVITY:  You should plan to take it easy for the rest of today and you should NOT DRIVE or use heavy machinery until tomorrow (because of the sedation medicines used during the test).    FOLLOW UP: Our staff will call the number listed on your records 48-72 hours following your procedure to check on you and address any questions or concerns that you may have regarding the information given to you following your procedure. If we do not reach you, we will leave a message.  We will attempt to reach you two times.  During this call, we will ask if you have developed any symptoms of COVID 19. If you develop any symptoms (ie: fever, flu-like symptoms, shortness of breath, cough etc.) before then, please call [phone].  If you test positive for Covid 19 in the 2 weeks post procedure, please call and report this information to [place].    If any biopsies were taken you will be contacted by phone or by letter within the next 1-3 weeks.  Please call us at [phone] if  you have not heard about the biopsies in 3 weeks.    SIGNATURES/CONFIDENTIALITY: You and/or your care partner have signed paperwork which will be entered into your electronic medical record.  These signatures attest to the fact that that the information above on your After Visit Summary has been reviewed and is understood.  Full responsibility of the confidentiality of this discharge information lies with you and/or your care-partner.

## 2019-06-25 NOTE — Progress Notes (Signed)
Temps taken by [place] VS taken by CW

## 2019-06-25 NOTE — Progress Notes (Signed)
Report to PACU, RN, vss, BBS= Clear.  

## 2019-06-26 ENCOUNTER — Encounter: Payer: Self-pay | Admitting: Hematology

## 2019-06-26 ENCOUNTER — Other Ambulatory Visit: Payer: Self-pay

## 2019-06-26 ENCOUNTER — Inpatient Hospital Stay (HOSPITAL_BASED_OUTPATIENT_CLINIC_OR_DEPARTMENT_OTHER): Payer: Medicaid Other | Admitting: Hematology

## 2019-06-26 ENCOUNTER — Inpatient Hospital Stay: Payer: Medicaid Other | Attending: Hematology

## 2019-06-26 VITALS — BP 121/95 | HR 66 | Temp 97.5°F | Resp 18 | Ht 62.0 in | Wt 97.5 lb

## 2019-06-26 DIAGNOSIS — C187 Malignant neoplasm of sigmoid colon: Secondary | ICD-10-CM

## 2019-06-26 DIAGNOSIS — C19 Malignant neoplasm of rectosigmoid junction: Secondary | ICD-10-CM

## 2019-06-26 DIAGNOSIS — Z720 Tobacco use: Secondary | ICD-10-CM

## 2019-06-26 DIAGNOSIS — R109 Unspecified abdominal pain: Secondary | ICD-10-CM | POA: Insufficient documentation

## 2019-06-26 DIAGNOSIS — F1010 Alcohol abuse, uncomplicated: Secondary | ICD-10-CM

## 2019-06-26 DIAGNOSIS — F17210 Nicotine dependence, cigarettes, uncomplicated: Secondary | ICD-10-CM | POA: Insufficient documentation

## 2019-06-26 DIAGNOSIS — K921 Melena: Secondary | ICD-10-CM

## 2019-06-26 LAB — CBC WITH DIFFERENTIAL (CANCER CENTER ONLY)
Abs Immature Granulocytes: 0.01 10*3/uL (ref 0.00–0.07)
Basophils Absolute: 0.1 10*3/uL (ref 0.0–0.1)
Basophils Relative: 1 %
Eosinophils Absolute: 0.2 10*3/uL (ref 0.0–0.5)
Eosinophils Relative: 3 %
HCT: 43.1 % (ref 36.0–46.0)
Hemoglobin: 14.1 g/dL (ref 12.0–15.0)
Immature Granulocytes: 0 %
Lymphocytes Relative: 35 %
Lymphs Abs: 2.4 10*3/uL (ref 0.7–4.0)
MCH: 30.8 pg (ref 26.0–34.0)
MCHC: 32.7 g/dL (ref 30.0–36.0)
MCV: 94.1 fL (ref 80.0–100.0)
Monocytes Absolute: 0.5 10*3/uL (ref 0.1–1.0)
Monocytes Relative: 7 %
Neutro Abs: 3.6 10*3/uL (ref 1.7–7.7)
Neutrophils Relative %: 54 %
Platelet Count: 242 10*3/uL (ref 150–400)
RBC: 4.58 MIL/uL (ref 3.87–5.11)
RDW: 11.8 % (ref 11.5–15.5)
WBC Count: 6.7 10*3/uL (ref 4.0–10.5)
nRBC: 0 % (ref 0.0–0.2)

## 2019-06-26 LAB — CMP (CANCER CENTER ONLY)
ALT: 12 U/L (ref 0–44)
AST: 21 U/L (ref 15–41)
Albumin: 4.1 g/dL (ref 3.5–5.0)
Alkaline Phosphatase: 53 U/L (ref 38–126)
Anion gap: 8 (ref 5–15)
BUN: 11 mg/dL (ref 6–20)
CO2: 27 mmol/L (ref 22–32)
Calcium: 8.7 mg/dL — ABNORMAL LOW (ref 8.9–10.3)
Chloride: 104 mmol/L (ref 98–111)
Creatinine: 0.72 mg/dL (ref 0.44–1.00)
GFR, Est AFR Am: >60 mL/min (ref >60)
GFR, Estimated: >60 mL/min (ref >60)
Glucose, Bld: 92 mg/dL (ref 70–99)
Potassium: 3.5 mmol/L (ref 3.5–5.1)
Sodium: 139 mmol/L (ref 135–145)
Total Bilirubin: 0.4 mg/dL (ref 0.3–1.2)
Total Protein: 6.4 g/dL — ABNORMAL LOW (ref 6.5–8.1)

## 2019-06-26 LAB — CEA: CEA: 1.8 ng/mL

## 2019-06-26 NOTE — Progress Notes (Signed)
Appt 07/07/19 at 9:40 Dr.Thomas; patient is aware of appt; per [facility] staff;

## 2019-06-26 NOTE — Progress Notes (Signed)
Staff message sent to [facility] referral coordinator to check status of patient appt-awaiting response

## 2019-06-29 ENCOUNTER — Telehealth: Payer: Self-pay | Admitting: *Deleted

## 2019-06-29 ENCOUNTER — Telehealth: Payer: Self-pay | Admitting: Hematology

## 2019-06-29 ENCOUNTER — Encounter: Payer: Self-pay | Admitting: *Deleted

## 2019-06-29 LAB — CEA (IN HOUSE-CHCC): CEA (CHCC-In House): 3.93 ng/mL (ref 0.00–5.00)

## 2019-06-29 NOTE — Telephone Encounter (Signed)
Called and [facility] for patient with date/time of appointment added per 7/31 los

## 2019-06-29 NOTE — Telephone Encounter (Signed)
1. Have you developed a fever since your procedure? no  2.   Have you had an respiratory symptoms (SOB or cough) since your procedure? no  3.   Have you tested positive for COVID 19 since your procedure no  4.   Have you had any family members/close contacts diagnosed with the COVID 19 since your procedure?  no   If yes to any of these questions please route to Joylene John, RN and Alphonsa Gin, Therapist, sports.  Follow up Call-  Call back number 06/25/2019  Post procedure Call Back phone  # [phone]  Permission to leave phone message Yes  Some recent data might be hidden     Patient questions:  Do you have a fever, pain , or abdominal swelling? No. Pain Score  0 *  Have you tolerated food without any problems? Yes.    Have you been able to return to your normal activities? Yes.    Do you have any questions about your discharge instructions: Diet   No. Medications  No. Follow up visit  No.  Do you have questions or concerns about your Care? No.  Actions: * If pain score is 4 or above: No action needed, pain <4.

## 2019-06-29 NOTE — Progress Notes (Signed)
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  Reviewed visit with Dr Maylon Peppers and patient has no further needs, beyond CT scans which are already scheduled for tomorrow. Patient will follow up with Dr Maylon Peppers on 07/09/19. I will introduce myself at that time.

## 2019-06-30 ENCOUNTER — Other Ambulatory Visit: Payer: Self-pay

## 2019-06-30 ENCOUNTER — Ambulatory Visit (INDEPENDENT_AMBULATORY_CARE_PROVIDER_SITE_OTHER)
Admission: RE | Admit: 2019-06-30 | Discharge: 2019-06-30 | Disposition: A | Discharge status: Home / Self Care | Payer: Self-pay | Source: Ambulatory Visit | Attending: Gastroenterology | Admitting: Gastroenterology

## 2019-06-30 DIAGNOSIS — K6289 Other specified diseases of anus and rectum: Secondary | ICD-10-CM

## 2019-06-30 MED ORDER — IOHEXOL 300 MG/ML  SOLN
100.0000 mL | Freq: Once | INTRAMUSCULAR | Status: AC | PRN
  Administered 2019-06-30: 100 mL via INTRAVENOUS

## 2019-07-02 ENCOUNTER — Other Ambulatory Visit: Payer: Self-pay | Admitting: Hematology

## 2019-07-02 DIAGNOSIS — C20 Malignant neoplasm of rectum: Secondary | ICD-10-CM

## 2019-07-03 ENCOUNTER — Other Ambulatory Visit: Payer: Self-pay | Admitting: Family

## 2019-07-03 ENCOUNTER — Telehealth: Payer: Self-pay | Admitting: Hematology

## 2019-07-03 ENCOUNTER — Other Ambulatory Visit: Payer: Self-pay | Admitting: Hematology

## 2019-07-03 ENCOUNTER — Telehealth: Payer: Self-pay | Admitting: Gastroenterology

## 2019-07-03 DIAGNOSIS — C187 Malignant neoplasm of sigmoid colon: Secondary | ICD-10-CM

## 2019-07-03 DIAGNOSIS — A048 Other specified bacterial intestinal infections: Secondary | ICD-10-CM

## 2019-07-03 MED ORDER — METRONIDAZOLE 250 MG PO TABS: 250.0000 mg | ORAL_TABLET | Freq: Four times a day (QID) | ORAL | 0 refills | Status: AC

## 2019-07-03 MED ORDER — BISMUTH SUBSALICYLATE 262 MG PO CHEW: 524.0000 mg | CHEWABLE_TABLET | Freq: Four times a day (QID) | ORAL | 0 refills | Status: AC

## 2019-07-03 MED ORDER — DOXYCYCLINE HYCLATE 50 MG PO CAPS: 100.0000 mg | ORAL_CAPSULE | Freq: Two times a day (BID) | ORAL | 0 refills | Status: AC

## 2019-07-03 NOTE — Telephone Encounter (Addendum)
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  The colon polyps were otherwise benign Hyperplastic Polyps.  She has already been seen by Dr. Maylon Peppers in the [facility] and has a pending appointment with Colorectal Surgery at [facility].  Biopsies also demonstrate normal duodenum, but H. pylori gastritis, and will plan on treating as below.  We additionally reviewed the results of her CT, which demonstrates local nodes, but no distant metastasis noted.  Will relay to her Oncologist to discuss the utility of MRI vs lower EUS vs surgical.  Reace Breshears, Please order for the following for treatment of her H. Pylori:  1) Omeprazole 20 mg 2 times a day x 14 d 2) Pepto Bismol 2 tabs (262 mg each) 4 times a day x 14 d 3) Metronidazole 250 mg 4 times a day x 14 d 4) doxycycline 100 mg 2 times a day x 14 d  After 14 days, ok to stop omeprazole.  4 weeks after treatment completed, check H. Pylori stool antigen to confirm eradication (must be off acid suppression therapy)

## 2019-07-03 NOTE — Telephone Encounter (Signed)
Called and LMVM regarding MR appointment date/time/location as well as follow up appointment date/ tim being moved as requested per 8/7 sch msg Letter/calendar mailed as well

## 2019-07-09 ENCOUNTER — Ambulatory Visit: Payer: Self-pay | Admitting: Hematology

## 2019-07-10 ENCOUNTER — Telehealth: Payer: Self-pay

## 2019-07-10 ENCOUNTER — Other Ambulatory Visit: Payer: Self-pay | Admitting: Hematology

## 2019-07-10 DIAGNOSIS — C20 Malignant neoplasm of rectum: Secondary | ICD-10-CM

## 2019-07-10 NOTE — Telephone Encounter (Signed)
Covid-19 screening questions   Do you now or have you had a fever in the last 14 days? No  Do you have any respiratory symptoms of shortness of breath or cough now or in the last 14 days? NO  Do you have any family members or close contacts with diagnosed or suspected Covid-19 in the past 14 days? No  Have you been tested for Covid-19 and found to be positive? NO

## 2019-07-11 ENCOUNTER — Ambulatory Visit (HOSPITAL_BASED_OUTPATIENT_CLINIC_OR_DEPARTMENT_OTHER): Payer: Self-pay

## 2019-07-13 ENCOUNTER — Ambulatory Visit (INDEPENDENT_AMBULATORY_CARE_PROVIDER_SITE_OTHER): Payer: Self-pay | Admitting: Gastroenterology

## 2019-07-13 ENCOUNTER — Telehealth: Payer: Self-pay | Admitting: Hematology

## 2019-07-13 ENCOUNTER — Other Ambulatory Visit: Payer: Self-pay

## 2019-07-13 VITALS — BP 112/62 | HR 87 | Temp 98.6°F | Ht 63.5 in | Wt 96.1 lb

## 2019-07-13 DIAGNOSIS — K2100 Gastro-esophageal reflux disease with esophagitis, without bleeding: Secondary | ICD-10-CM

## 2019-07-13 DIAGNOSIS — C19 Malignant neoplasm of rectosigmoid junction: Secondary | ICD-10-CM

## 2019-07-13 DIAGNOSIS — Z87891 Personal history of nicotine dependence: Secondary | ICD-10-CM

## 2019-07-13 DIAGNOSIS — A048 Other specified bacterial intestinal infections: Secondary | ICD-10-CM

## 2019-07-13 NOTE — Progress Notes (Signed)
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  Was initially seen on 06/09/2019 endorsing hematochezia x5 months along with change in bowel habits.  FOBT positive, otherwise normal CMP and CBC.  Has been evaluated by Dr. Maylon Peppers (Oncology), and Dr. Dema Severin (Colorectal Surgery).  MRI pelvis scheduled for later this week.  CEA normal.  CT C/A/P completed n/f several prominent but not enlarged mesorectal LN's, no sites of distant mets, nonspecific pulmonary nodules.  Separately, has a longstanding hx of GERD, characterized HB, regurgitation. Occurs 1-2 times/week. Worse with spicy foods. No dysphagia.    Now controlled with Prilosec 20 mg/day.  Recent EGD with H. pylori gastritis, treated with quadruple therapy.  FHx notable for father w/ polyps. Otherwise, no FHx of CRC, GI malignancy, IBD.   Endoscopic History: - Colonoscopy (05/2019, Dr. Bryan Lemma): 6 cm rectosigmoid Adenocarcinoma with oozing, tattoo placed proximal and distal, internal hemorrhoids.  Distal transverse colon was extent reached due to needing smaller caliber endoscope to traverse mass. -EGD (05/2019, Dr. Bryan Lemma): LA Grade A esophagitis, esophageal diverticulum, H. pylori gastritis, normal duodenum.  Treated with quadruple therapy  HPI:     Patient is a 48 y.o. female presenting [facility]ology [facility] for follow-up.  She was initially seen by me in 05/2019 for hematochezia and change in bowel habits, diagnosed with rectosigmoid Adenocarcinoma.  States bowel habits have improved, with reduced/rare BRBPR.  Otherwise tolerating p.o. intake without issue.  Has been seen by Dr. Maylon Peppers and Dr. Dema Severin, with MRI pelvis later this week.  She has stopped smoking since her diagnosis.    Reflux well controlled with Prilosec daily (was previously BID). Will complete Abx for H. pylori in another 4  days- tolerating well.   Review of systems:     No chest pain, no SOB, no fevers, no urinary sx   No past medical history on file.  Patient's surgical history, family medical history, social history, medications and allergies were all reviewed in Epic    Current Outpatient Medications  Medication Sig Dispense Refill  . bismuth subsalicylate (PEPTO-BISMOL) 262 MG chewable tablet Chew 2 tablets (524 mg total) by mouth 4 (four) times daily for 14 days. (Patient not taking: Reported on 07/10/2019) 112 tablet 0  . doxycycline (VIBRAMYCIN) 50 MG capsule Take 2 capsules (100 mg total) by mouth 2 (two) times daily for 14 days. 56 capsule 0  . metroNIDAZOLE (FLAGYL) 250 MG tablet Take 1 tablet (250 mg total) by mouth 4 (four) times daily for 14 days. 56 tablet 0  . omeprazole (PRILOSEC) 20 MG capsule Take 1 capsule (20 mg total) by mouth 2 (two) times daily before a meal. (Patient taking differently: Take 20 mg by mouth daily. ) 180 capsule 1   No current facility-administered medications for this visit.     Physical Exam:     There were no vitals taken for this visit.  GENERAL:  Pleasant female in NAD PSYCH: : Cooperative, normal affect EENT:  conjunctiva pink, mucous membranes moist, neck supple without masses CARDIAC:  RRR, no murmur heard, no peripheral edema PULM: Normal respiratory effort, lungs CTA bilaterally, no wheezing ABDOMEN:  Nondistended, soft, nontender. No obvious masses, no hepatomegaly,  normal bowel sounds SKIN:  turgor, no lesions seen Musculoskeletal:  Normal muscle tone, normal strength NEURO: Alert and oriented x 3, no focal neurologic deficits   IMPRESSION  and PLAN:    #1.  Rectosigmoid Adenocarcinoma: - MRI pelvis scheduled for later this week -Follow-up with Dr. Maylon Peppers and Dr. Dema Severin as already planned - Plan for repeat colonoscopy 1 year after treatment of CA - She otherwise seems to be processing her newly diagnosed cancer well  #2.  GERD with Erosive  Esophagitis:  -Treated with  Bid therapy, which she reduced back to daily due to cost.  Symptoms well-controlled with Prilosec 20 mg/day -Resume antireflux lifestyle measures  #3.  H. pylori Gastritis: -Complete quadruple therapy as prescribed -Already ordered for H. pylori stool antigen 4 weeks after completion of therapy, and holding PPI)  #5.  History of tobacco use: - Congratulated her on her smoking cessation.  RTC in 6 months or sooner as needed      Lavena Bullion ,DO, FACG 07/13/2019, 3:50 PM

## 2019-07-13 NOTE — Patient Instructions (Signed)
If you are age 49 or older, your body mass index should be between 23-30. Your Body mass index is 16.76 kg/m. If this is out of the aforementioned range listed, please consider follow up with your Primary Care Provider.  If you are age 72 or younger, your body mass index should be between 19-25. Your Body mass index is 16.76 kg/m. If this is out of the aformentioned range listed, please consider follow up with your Primary Care Provider.   Follow up in 6-2 months.  It was a pleasure to see you today!  Vito Cirigliano, D.O.

## 2019-07-13 NOTE — Telephone Encounter (Signed)
lmom to inform patient of resch office visit to 8/20 at 130 pm per 8/17 sch msg

## 2019-07-14 ENCOUNTER — Other Ambulatory Visit: Payer: Self-pay | Admitting: Hematology

## 2019-07-14 ENCOUNTER — Inpatient Hospital Stay: Payer: Medicaid Other | Admitting: Hematology

## 2019-07-14 ENCOUNTER — Other Ambulatory Visit (HOSPITAL_BASED_OUTPATIENT_CLINIC_OR_DEPARTMENT_OTHER): Payer: Self-pay | Admitting: Hematology

## 2019-07-14 DIAGNOSIS — C20 Malignant neoplasm of rectum: Secondary | ICD-10-CM

## 2019-07-15 ENCOUNTER — Ambulatory Visit (HOSPITAL_COMMUNITY): Payer: Self-pay

## 2019-07-16 ENCOUNTER — Ambulatory Visit: Payer: Self-pay | Admitting: Hematology

## 2019-07-17 ENCOUNTER — Other Ambulatory Visit: Payer: Self-pay

## 2019-07-17 ENCOUNTER — Ambulatory Visit
Admission: RE | Admit: 2019-07-17 | Discharge: 2019-07-17 | Disposition: A | Discharge status: Home / Self Care | Payer: Medicaid Other | Source: Ambulatory Visit | Attending: Hematology | Admitting: Hematology

## 2019-07-17 DIAGNOSIS — C20 Malignant neoplasm of rectum: Secondary | ICD-10-CM

## 2019-07-17 MED ORDER — GADOBUTROL 1 MMOL/ML IV SOLN
4.0000 mL | Freq: Once | INTRAVENOUS | Status: AC | PRN
  Administered 2019-07-17: 4 mL via INTRAVENOUS

## 2019-07-18 ENCOUNTER — Other Ambulatory Visit (HOSPITAL_BASED_OUTPATIENT_CLINIC_OR_DEPARTMENT_OTHER): Payer: Self-pay

## 2019-07-21 ENCOUNTER — Telehealth: Payer: Self-pay | Admitting: Radiation Oncology

## 2019-07-21 ENCOUNTER — Encounter: Payer: Self-pay | Admitting: *Deleted

## 2019-07-21 ENCOUNTER — Ambulatory Visit (HOSPITAL_COMMUNITY): Payer: Self-pay

## 2019-07-21 DIAGNOSIS — C187 Malignant neoplasm of sigmoid colon: Secondary | ICD-10-CM

## 2019-07-21 NOTE — Telephone Encounter (Signed)
New message: ° ° °LVM for patient to return call to schedule appt from referral received. °

## 2019-07-22 ENCOUNTER — Inpatient Hospital Stay: Payer: Medicaid Other | Attending: Hematology | Admitting: Hematology

## 2019-07-22 ENCOUNTER — Telehealth: Payer: Self-pay | Admitting: Radiation Oncology

## 2019-07-22 ENCOUNTER — Other Ambulatory Visit (HOSPITAL_COMMUNITY): Payer: Self-pay

## 2019-07-22 ENCOUNTER — Telehealth: Payer: Self-pay | Admitting: Hematology

## 2019-07-22 ENCOUNTER — Encounter: Payer: Self-pay | Admitting: Hematology

## 2019-07-22 ENCOUNTER — Ambulatory Visit (HOSPITAL_COMMUNITY): Payer: Self-pay

## 2019-07-22 ENCOUNTER — Other Ambulatory Visit: Payer: Self-pay

## 2019-07-22 VITALS — BP 125/76 | HR 75 | Temp 98.7°F | Resp 17 | Ht 63.5 in | Wt 95.8 lb

## 2019-07-22 DIAGNOSIS — F1011 Alcohol abuse, in remission: Secondary | ICD-10-CM

## 2019-07-22 DIAGNOSIS — Z79899 Other long term (current) drug therapy: Secondary | ICD-10-CM | POA: Insufficient documentation

## 2019-07-22 DIAGNOSIS — F1010 Alcohol abuse, uncomplicated: Secondary | ICD-10-CM | POA: Diagnosis not present

## 2019-07-22 DIAGNOSIS — F17210 Nicotine dependence, cigarettes, uncomplicated: Secondary | ICD-10-CM | POA: Diagnosis not present

## 2019-07-22 DIAGNOSIS — C19 Malignant neoplasm of rectosigmoid junction: Secondary | ICD-10-CM | POA: Insufficient documentation

## 2019-07-22 DIAGNOSIS — C20 Malignant neoplasm of rectum: Secondary | ICD-10-CM

## 2019-07-22 DIAGNOSIS — Z720 Tobacco use: Secondary | ICD-10-CM

## 2019-07-22 MED ORDER — LORAZEPAM 0.5 MG PO TABS: 0.5000 mg | ORAL_TABLET | Freq: Four times a day (QID) | ORAL | 0 refills | Status: DC | PRN

## 2019-07-22 MED ORDER — ONDANSETRON HCL 8 MG PO TABS: 8.0000 mg | ORAL_TABLET | Freq: Two times a day (BID) | ORAL | 1 refills | Status: DC | PRN

## 2019-07-22 MED ORDER — CAPECITABINE 500 MG PO TABS: 1000.0000 mg | ORAL_TABLET | Freq: Two times a day (BID) | ORAL | 0 refills | Status: AC

## 2019-07-22 MED ORDER — PROCHLORPERAZINE MALEATE 10 MG PO TABS: 10.0000 mg | ORAL_TABLET | Freq: Four times a day (QID) | ORAL | 1 refills | Status: DC | PRN

## 2019-07-22 MED ORDER — LOPERAMIDE HCL 2 MG PO CAPS: ORAL_CAPSULE | ORAL | 1 refills | Status: DC

## 2019-07-22 NOTE — Progress Notes (Signed)
Sarah [name] OFFICE PROGRESS NOTE  Patient Care Team: Sarah [name]., MD as PCP - General (Internal Medicine) Sarah Poche, RN as Oncology Nurse Navigator Sarah Men, MD as Medical Oncologist (Oncology)  HEME/ONC OVERVIEW: 1. 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  MRI pelvis showed cT2N0 rectosigmoid malignancy, 8.8 cm from the anal sphincter, without any definite pelvic adenopathy. -I discussed the case in detail with Dr. Leighton [name] of GI surgery.  She was concerned about some of the pelvic LN's adjacent to the rectosigmoid malignancy, which were indeterminate by MRI, and recommended proceeding with neoadjuvant therapy first, followed by surgical resection. -I reviewed the NCCN guideline in detail with the patient -Given the questionable pelvic LN's on MRI, I agree with proceeding with neoadjuvant chemoradiation with Xeloda -We discussed some of the risks, benefits and side-effects of Xeloda. The plan is for Xeloda 825mg /m2 BID, M-F, concurrent with RT (~1000mg  BID) -Some of the short term side-effects included, though not limited to, risk of fatigue, weight loss, tumor lysis syndrome, risk of allergic reactions, pancytopenia, life-threatening infections, need for transfusions of blood products, nausea, vomiting, change in bowel habits, admission to hospital for various reasons, and risks of death.  -Long term side-effects are also  discussed including permanent damage to nerve function, chronic fatigue, and rare secondary malignancy including bone marrow disorders.  -The patient is aware that the response rates discussed earlier is not guaranteed.   -After a long discussion, patient made an informed decision to proceed with the prescribed plan of care.  -I have sent the prescription to the [facility] for authorization and any patient assistance as needed -I also discussed the case with radiation oncology, and we are planning to tentatively start treatment on 08/04/2019, for ~5 and 1/2 weeks   Tobacco abuse -Patient quit smoking in early 06/2019 -I congratulated the patient on quitting EtOH and counseled her on the importance of abstinence from EtOH products  EtOH abuse -Patient quit drinking in early 06/2019 -I congratulated the patient on quitting tobacco and counseled her on the importance of abstinence from tobacco products  Orders Placed This Encounter  Procedures  . CBC with Differential (Cancer Center Only)    Standing Status:   Standing    Number of Occurrences:   20    Standing Expiration Date:   07/21/2020  . CMP ([facility] only)    Standing Status:   Standing    Number of Occurrences:   20    Standing Expiration Date:   07/21/2020  . Magnesium    Standing Status:   Standing    Number of Occurrences:   20    Standing Expiration Date:   07/21/2020    All questions were answered. The patient knows to call the clinic with any problems, questions or concerns. No barriers to learning was detected.  A total of more than 40 minutes were spent face-to-face with the patient during this encounter and over half of that time was spent on  counseling and coordination of care as outlined above.   Return in ~3 weeks for labs and clinic follow-up.   Sarah Men, MD 07/22/2019 12:20 PM  CHIEF COMPLAINT: "I am doing better"  INTERVAL HISTORY: Sarah [name] returns to clinic for follow-up of adenocarcinoma of the  rectosigmoid colon.  The patient reports that she still has trace blood with bowel movements and occasional blood on the toilet paper, but her bowel movement has become more regular and is soft.  She denies any associated abdominal pain, nausea, vomiting, diarrhea, or melena.  She quit smoking and drinking in early 06/2019, except occasional cigarette, but she has developed a strategy to use gums and candies to keep her from resuming smoking.  She otherwise reports feeling well today, and denies any complaint.  SUMMARY OF ONCOLOGIC HISTORY: Oncology History  Rectal adenocarcinoma ([place])  06/25/2019 Initial Diagnosis   Colon cancer ([place])   06/25/2019 Procedure   Colonoscopy: - Hemorrhoids found on perianal exam. - One 9 mm polyp in the sigmoid colon, removed with a cold snare. Resected and retrieved. - Malignant partially obstructing tumor in the recto-sigmoid colon. Biopsied. Tattooed. - Non-bleeding internal hemorrhoids. - The descending colon and distal transverse colon are normal.   06/30/2019 Imaging   CT chest, abdomen and pelvis w/ contrast:  IMPRESSION: 1. Extensive mural thickening in the proximal rectum compatible with the recently diagnosed rectal mass. There are several prominent but nonenlarged mesorectal lymph nodes associated with this which are nonspecific, but concerning for potential nodal disease. No other sites of distal metastatic disease confidently identified elsewhere in the chest, abdomen or pelvis. 2. A few scattered 1-3 mm peripheral predominant pulmonary nodules. These are nonspecific, but strongly favored to be benign. Attention at time of routine follow-up is recommended to ensure stability. 3. Old granulomatous disease, as above.   07/17/2019 Imaging   MRI pelvis w/ contrast:  IMPRESSION: Rectal adenocarcinoma T stage: 2   Rectal adenocarcinoma N stage:  0   Distance from tumor to the internal anal sphincter is 8.8 cm.   07/22/2019 Cancer Staging    Staging form: Colon and Rectum - Neuroendocine Tumors, AJCC 8th Edition - Clinical: Stage IIA (cT2, cN0, cM0) - Signed by Sarah Men, MD on 07/22/2019   08/04/2019 -  Chemotherapy   The patient had [No matching medication found in this treatment plan]  for chemotherapy treatment.      REVIEW OF SYSTEMS:   Constitutional: ( - ) fevers, ( - )  chills , ( - ) night sweats Eyes: ( - ) blurriness of vision, ( - ) double vision, ( - ) watery eyes Ears, nose, mouth, throat, and face: ( - ) mucositis, ( - ) sore throat Respiratory: ( - ) cough, ( - ) dyspnea, ( - ) wheezes Cardiovascular: ( - ) palpitation, ( - ) chest discomfort, ( - ) lower extremity swelling Gastrointestinal:  ( - ) nausea, ( - ) heartburn, ( + ) change in bowel habits Skin: ( - ) abnormal skin rashes Lymphatics: ( - ) new lymphadenopathy, ( - ) easy bruising Neurological: ( - ) numbness, ( - ) tingling, ( - ) new weaknesses Behavioral/Psych: ( - ) mood change, ( - ) new changes  All other systems were reviewed with the patient and are negative.  I have reviewed the past medical history, past surgical history, social history and family history with the patient and they are unchanged from previous note.  ALLERGIES:  has No Known Allergies.  MEDICATIONS:  Current Outpatient Medications  Medication Sig Dispense Refill  . omeprazole (PRILOSEC) 20 MG capsule Take 1 capsule (20 mg total) by mouth 2 (two) times daily before a meal. (Patient taking differently: Take 20 mg by mouth daily. ) 180 capsule 1  . capecitabine (XELODA) 500 MG tablet Take 2 tablets (1,000 mg total) by mouth 2 (two) times daily after a meal. Take Monday through Friday during radiation. 120 tablet 0  . loperamide (IMODIUM) 2 MG capsule Take 2 at diarrhea onset, then 1 every 2hr until 12hrs with no BM. May take 2 every 4hrs at night. If diarrhea recurs repeat. 100 capsule 1  . LORazepam (ATIVAN) 0.5 MG tablet Take 1 tablet (0.5 mg total) by mouth every 6 (six)  hours as needed (Nausea or vomiting). 30 tablet 0  . ondansetron (ZOFRAN) 8 MG tablet Take 1 tablet (8 mg total) by mouth 2 (two) times daily as needed (Nausea or vomiting). 30 tablet 1  . prochlorperazine (COMPAZINE) 10 MG tablet Take 1 tablet (10 mg total) by mouth every 6 (six) hours as needed (Nausea or vomiting). 30 tablet 1   No current facility-administered medications for this visit.     PHYSICAL EXAMINATION: ECOG PERFORMANCE STATUS: 1 - Symptomatic but completely ambulatory  Today's Vitals   07/22/19 1132 07/22/19 1134  BP: 125/76   Pulse: 75   Resp: 17   Temp: 98.7 F (37.1 C)   TempSrc: Oral   SpO2: 99%   Weight: 95 lb 12.8 oz (43.5 kg)   Height: 5' 3.5" (1.613 m)   PainSc:  0-No pain   Body mass index is 16.7 kg/m.  Filed Weights   07/22/19 1132  Weight: 95 lb 12.8 oz (43.5 kg)    GENERAL: alert, no distress and comfortable, thin  SKIN: skin color, texture, turgor are normal, no rashes or significant lesions EYES: conjunctiva are pink and non-injected, sclera clear OROPHARYNX: no exudate, no erythema; lips, buccal mucosa, and tongue normal  NECK: supple, non-tender LUNGS: clear to auscultation with normal breathing effort HEART: regular rate & rhythm and no murmurs and no lower extremity edema ABDOMEN: soft, non-tender, non-distended, normal bowel sounds Musculoskeletal: no cyanosis of digits and no clubbing  PSYCH: alert & oriented x 3, fluent speech NEURO: no focal motor/sensory deficits  LABORATORY DATA:  I have reviewed the data as listed    Component Value Date/Time   NA 139 06/26/2019 1040   K 3.5 06/26/2019 1040   CL 104 06/26/2019 1040   CO2 27 06/26/2019 1040   GLUCOSE 92 06/26/2019 1040   BUN 11 06/26/2019 1040   CREATININE 0.72 06/26/2019 1040   CALCIUM 8.7 (L) 06/26/2019 1040   PROT 6.4 (L) 06/26/2019 1040   ALBUMIN 4.1 06/26/2019 1040   AST 21 06/26/2019 1040   ALT 12 06/26/2019 1040   ALKPHOS 53 06/26/2019 1040   BILITOT 0.4  06/26/2019 1040   GFRNONAA >60 06/26/2019 1040   GFRAA >60 06/26/2019 1040    No results found for: SPEP, UPEP  Lab Results  Component Value Date   WBC 6.7 06/26/2019   NEUTROABS 3.6 06/26/2019   HGB 14.1 06/26/2019   HCT 43.1 06/26/2019   MCV 94.1 06/26/2019   PLT 242 06/26/2019      Chemistry      Component Value Date/Time   NA 139 06/26/2019 1040   K 3.5 06/26/2019 1040   CL 104 06/26/2019 1040   CO2 27 06/26/2019 1040   BUN 11 06/26/2019 1040  CREATININE 0.72 06/26/2019 1040      Component Value Date/Time   CALCIUM 8.7 (L) 06/26/2019 1040   ALKPHOS 53 06/26/2019 1040   AST 21 06/26/2019 1040   ALT 12 06/26/2019 1040   BILITOT 0.4 06/26/2019 1040       RADIOGRAPHIC STUDIES: I have personally reviewed the radiological images as listed below and agreed with the findings in the report. Ct Chest W Contrast  Result Date: 07/01/2019 CLINICAL DATA:  48 year old female with 7 month history of blood in stool and low rectal pressure. Weight loss. New rectal mass noted on recent colonoscopy. EXAM: CT CHEST, ABDOMEN, AND PELVIS WITH CONTRAST TECHNIQUE: Multidetector CT imaging of the chest, abdomen and pelvis was performed following the standard protocol during bolus administration of intravenous contrast. CONTRAST:  130mL OMNIPAQUE IOHEXOL 300 MG/ML  SOLN COMPARISON:  No priors. FINDINGS: CT CHEST FINDINGS Cardiovascular: Heart size is normal. There is no significant pericardial fluid, thickening or pericardial calcification. No atherosclerotic calcifications are noted in the thoracic aorta or the coronary arteries. Mediastinum/Nodes: No pathologically enlarged mediastinal or hilar lymph nodes. Several densely calcified mediastinal and right hilar lymph nodes are incidentally noted. Esophagus is unremarkable in appearance. No axillary lymphadenopathy. Lungs/Pleura: Small calcified granuloma in the right lower lobe. A few scattered 1-3 mm pulmonary nodules are noted in the periphery  of the lungs bilaterally, nonspecific, but favored to be benign. No other suspicious appearing pulmonary nodules or masses are noted. No acute consolidative airspace disease. No pleural effusions. Musculoskeletal: There are no aggressive appearing lytic or blastic lesions noted in the visualized portions of the skeleton. CT ABDOMEN PELVIS FINDINGS Hepatobiliary: No suspicious cystic or solid hepatic lesions. No intra or extrahepatic biliary ductal dilatation. A few scattered calcified granulomas are incidentally noted. Gallbladder is normal in appearance. Pancreas: No pancreatic mass. No pancreatic ductal dilatation. No pancreatic or peripancreatic fluid collections or inflammatory changes. Spleen: Calcified granulomas in the spleen. Adrenals/Urinary Tract: Bilateral kidneys and adrenal glands are normal in appearance. No hydroureteronephrosis. Urinary bladder is normal in appearance. Stomach/Bowel: Normal appearance of the stomach. No pathologic dilatation of small bowel or colon. Mural thickening noted in the proximal rectum, best visualized on axial image 101 of series 2, likely reflective of the recently diagnosed rectal mass. No definite extension of this soft tissue into the mesorectal fat. The appendix is not confidently identified and may be surgically absent. Regardless, there are no inflammatory changes noted adjacent to the cecum to suggest the presence of an acute appendicitis at this time. Vascular/Lymphatic: No significant atherosclerotic disease, aneurysm or dissection noted in the abdominal or pelvic vasculature. Circumaortic left renal vein (normal anatomical variant) incidentally noted. No lymphadenopathy identified in the abdomen or pelvis. Several nonenlarged mesorectal lymph nodes are noted measuring up to 5 mm in short axis (axial image 97 of series 2). Reproductive: Uterus and ovaries are unremarkable in appearance. Other: No significant volume of ascites.  No pneumoperitoneum.  Musculoskeletal: There are no aggressive appearing lytic or blastic lesions noted in the visualized portions of the skeleton. IMPRESSION: 1. Extensive mural thickening in the proximal rectum compatible with the recently diagnosed rectal mass. There are several prominent but nonenlarged mesorectal lymph nodes associated with this which are nonspecific, but concerning for potential nodal disease. No other sites of distal metastatic disease confidently identified elsewhere in the chest, abdomen or pelvis. 2. A few scattered 1-3 mm peripheral predominant pulmonary nodules. These are nonspecific, but strongly favored to be benign. Attention at time of routine follow-up is recommended  to ensure stability. 3. Old granulomatous disease, as above. Electronically Signed   By: Vinnie Langton M.D.   On: 07/01/2019 08:14   Mr Pelvis W Wo Contrast  Result Date: 07/17/2019 CLINICAL DATA:  Malignant neoplasm of rectum. EXAM: MRI PELVIS WITHOUT AND WITH CONTRAST TECHNIQUE: Multiplanar multisequence MR imaging of the pelvis was performed both before and after administration of intravenous contrast. Small amount of [place] gel was administered per rectum to optimize tumor evaluation. CONTRAST:  4 cc Gadavist COMPARISON:  CT 06/30/2019 FINDINGS: TUMOR LOCATION Tumor distance from Anal Verge/Skin Surface: On the order of 12.2 cm, including on 18/2. Consistent with a high rectal to rectosigmoid junction lesion. Tumor distance from Internal Anal Sphincter: 8.8 cm, including on 24/4. TUMOR DESCRIPTION Circumferential Extent: Circumferential, including on 22/4. Tumor Length: 4.8 cm on 16/2 T - CATEGORY Extension through Muscularis Propria: No definite extension through the muscularis propria. On some images, apparent discontinuity of the T2 hypointense muscularis propria is felt to be due to obliquity. Example series 4. Extramural Vascular Invasion/Tumor Thrombus: No Invasion of Anterior Peritoneal Reflection: No Involvement of Adjacent  Organs or Pelvic Sidewall: No Levator Ani Involvement: No N - CATEGORY Mesorectal Lymph Nodes >=84mm: None=N0 Extra-mesorectal Lymphadenopathy: No-Nodes within the sigmoid mesocolon measure up to 5 mm on 45/3 and are indeterminate. Other: Normal urinary bladder. No bowel obstruction. No significant free pelvic fluid. Normal uterus for age. IMPRESSION: Rectal adenocarcinoma T stage: 2 Rectal adenocarcinoma N stage:  0 Distance from tumor to the internal anal sphincter is 8.8 cm. Electronically Signed   By: Abigail Miyamoto M.D.   On: 07/17/2019 13:20   Ct Abdomen Pelvis W Contrast  Result Date: 07/01/2019 CLINICAL DATA:  48 year old female with 7 month history of blood in stool and low rectal pressure. Weight loss. New rectal mass noted on recent colonoscopy. EXAM: CT CHEST, ABDOMEN, AND PELVIS WITH CONTRAST TECHNIQUE: Multidetector CT imaging of the chest, abdomen and pelvis was performed following the standard protocol during bolus administration of intravenous contrast. CONTRAST:  144mL OMNIPAQUE IOHEXOL 300 MG/ML  SOLN COMPARISON:  No priors. FINDINGS: CT CHEST FINDINGS Cardiovascular: Heart size is normal. There is no significant pericardial fluid, thickening or pericardial calcification. No atherosclerotic calcifications are noted in the thoracic aorta or the coronary arteries. Mediastinum/Nodes: No pathologically enlarged mediastinal or hilar lymph nodes. Several densely calcified mediastinal and right hilar lymph nodes are incidentally noted. Esophagus is unremarkable in appearance. No axillary lymphadenopathy. Lungs/Pleura: Small calcified granuloma in the right lower lobe. A few scattered 1-3 mm pulmonary nodules are noted in the periphery of the lungs bilaterally, nonspecific, but favored to be benign. No other suspicious appearing pulmonary nodules or masses are noted. No acute consolidative airspace disease. No pleural effusions. Musculoskeletal: There are no aggressive appearing lytic or blastic lesions  noted in the visualized portions of the skeleton. CT ABDOMEN PELVIS FINDINGS Hepatobiliary: No suspicious cystic or solid hepatic lesions. No intra or extrahepatic biliary ductal dilatation. A few scattered calcified granulomas are incidentally noted. Gallbladder is normal in appearance. Pancreas: No pancreatic mass. No pancreatic ductal dilatation. No pancreatic or peripancreatic fluid collections or inflammatory changes. Spleen: Calcified granulomas in the spleen. Adrenals/Urinary Tract: Bilateral kidneys and adrenal glands are normal in appearance. No hydroureteronephrosis. Urinary bladder is normal in appearance. Stomach/Bowel: Normal appearance of the stomach. No pathologic dilatation of small bowel or colon. Mural thickening noted in the proximal rectum, best visualized on axial image 101 of series 2, likely reflective of the recently diagnosed rectal mass. No definite  extension of this soft tissue into the mesorectal fat. The appendix is not confidently identified and may be surgically absent. Regardless, there are no inflammatory changes noted adjacent to the cecum to suggest the presence of an acute appendicitis at this time. Vascular/Lymphatic: No significant atherosclerotic disease, aneurysm or dissection noted in the abdominal or pelvic vasculature. Circumaortic left renal vein (normal anatomical variant) incidentally noted. No lymphadenopathy identified in the abdomen or pelvis. Several nonenlarged mesorectal lymph nodes are noted measuring up to 5 mm in short axis (axial image 97 of series 2). Reproductive: Uterus and ovaries are unremarkable in appearance. Other: No significant volume of ascites.  No pneumoperitoneum. Musculoskeletal: There are no aggressive appearing lytic or blastic lesions noted in the visualized portions of the skeleton. IMPRESSION: 1. Extensive mural thickening in the proximal rectum compatible with the recently diagnosed rectal mass. There are several prominent but nonenlarged  mesorectal lymph nodes associated with this which are nonspecific, but concerning for potential nodal disease. No other sites of distal metastatic disease confidently identified elsewhere in the chest, abdomen or pelvis. 2. A few scattered 1-3 mm peripheral predominant pulmonary nodules. These are nonspecific, but strongly favored to be benign. Attention at time of routine follow-up is recommended to ensure stability. 3. Old granulomatous disease, as above. Electronically Signed   By: Vinnie Langton M.D.   On: 07/01/2019 08:14

## 2019-07-22 NOTE — Progress Notes (Signed)
START ON PATHWAY REGIMEN - Colorectal     Administer Monday through Friday:     Capecitabine   **Always confirm dose/schedule in your pharmacy ordering system**  Patient Characteristics: Preoperative or Nonsurgical Candidate (Clinical Staging), Rectal, cT3 - cT4, cN0 or Any cT, cN+ Tumor Location: Rectal Therapeutic Status: Preoperative or Nonsurgical Candidate (Clinical Staging) AJCC T Category: cTX AJCC N Category: cNX AJCC M Category: cM0 AJCC 8 Stage Grouping: IIA Intent of Therapy: Curative Intent, Discussed with Patient

## 2019-07-22 NOTE — Telephone Encounter (Signed)
Scheduled appt per 8/26 los.  Printed calendar per patient request.

## 2019-07-22 NOTE — Progress Notes (Signed)
GI Location of Tumor / Histology: Rectosigmoid malignancy  Plan: Neoadjuvant chemoRT, surgery  Sarah [name] presented with urgency with bowel movements and hematochezia since 11/2018.  MRI Pelvis 07/17/2019:Rectal adenocarcinoma T stage: 2 Rectal adenocarcinoma N stage:  0 Distance from tumor to the internal anal sphincter is 8.8 cm  CT CAP 06/30/2019: Extensive mural thickening in the proximal rectum compatible with the recently diagnosed rectal mass. There are several prominent but nonenlarged mesorectal lymph nodes associated with this which are nonspecific, but concerning for potential nodal disease. No other sites of distal metastatic disease confidently identified elsewhere in the chest, abdomen or pelvis.  A few scattered 1-3 mm peripheral predominant pulmonary nodules. These are nonspecific, but strongly favored to be benign.  Colonoscopy 05/2019: Fungating, partially obstructing mass in the rectosigmoid colon, 13-19 mm from the anal verge.  Biopsies of Colon/Rectum 06/25/2019     Past/Anticipated interventions by surgeon, if any: Dr. Marcello Moores -neoadjuvant chemoRT then surgery  Past/Anticipated interventions by medical oncology, if any:  Dr. Maylon Peppers 07/22/2019 -I discussed the case in detail with Dr. [name] of GI surgery.  She was concerned about some of the pelvic LN's adjacent to the rectosigmoid malignancy, which were indeterminate by MRI, and recommended proceeding with neoadjuvant therapy first, followed by surgical resection. -Given the questionable pelvic LN's on MRI, I agree with proceeding with neoadjuvant chemoradiation with Xeloda. -I also discussed the case with radiation oncology, and we are planning to tentatively start treatment on 08/04/2019, for ~5 and 1/2 weeks   Dr. Maylon Peppers 06/26/2019 -Given the suspected colon cancer, CT CAP has been ordered to complete the staging. -I have also requested MSI testing on the colon biopsy, assuming the biopsy confirms colon cancer.  -She is scheduled with [facility] on 07/07/2019 to discuss feasibility of surgical resection.   Weight changes, if any: No significant changes, she fluctuates at baseline 98-102  Bowel/Bladder complaints, if any: Has some bowel frequency, going a little bit at a time.  Nausea / Vomiting, if any: No  Pain issues, if any: No  Any blood per rectum: Occasional  SAFETY ISSUES:  Prior radiation? No  Pacemaker/ICD? No  Possible current pregnancy? Tubal ligation, Postmenopausal  Is the patient on methotrexate? No  Current Complaints/Details:

## 2019-07-23 ENCOUNTER — Ambulatory Visit: Payer: Self-pay | Admitting: Hematology

## 2019-07-23 ENCOUNTER — Other Ambulatory Visit: Payer: Self-pay

## 2019-07-23 ENCOUNTER — Ambulatory Visit
Admission: RE | Admit: 2019-07-23 | Discharge: 2019-07-23 | Disposition: A | Discharge status: Home / Self Care | Payer: Medicaid Other | Source: Ambulatory Visit | Attending: Radiation Oncology | Admitting: Radiation Oncology

## 2019-07-23 ENCOUNTER — Encounter: Payer: Self-pay | Admitting: Radiation Oncology

## 2019-07-23 ENCOUNTER — Telehealth: Payer: Self-pay | Admitting: Pharmacist

## 2019-07-23 VITALS — Ht 63.5 in | Wt 95.0 lb

## 2019-07-23 DIAGNOSIS — C20 Malignant neoplasm of rectum: Secondary | ICD-10-CM

## 2019-07-23 NOTE — Progress Notes (Signed)
Radiation Oncology         [phone] ________________________________  Name: Sarah [name]        MRN: [id number]  Date of Service: 07/23/2019 DOB: 07-23-1971  QI:WLNL, Sarah Slim., MD  Tish Men, MD     REFERRING PHYSICIAN: Tish Men, MD   DIAGNOSIS: The encounter diagnosis was Rectal adenocarcinoma [facility]).   HISTORY OF PRESENT ILLNESS: Sarah [name] is a 48 y.o. female seen at the request of Dr. Maylon Peppers for newly diagnosed adenocarcinoma of the rectum.  The patient had been experiencing episodes of rectal fullness, frequency and intermittent rectal bleeding for approximately 5 months.  She had a fecal occult blood test that was positive and was seen for evaluation.  She underwent EGD and colonoscopy by Dr. Bryan Lemma on 06/25/2019 , And multiple biopsies were obtained she had benign appearing small bowel mucosa on biopsy, chronic inactive gastritis with H. pylori organisms in the gastric antrum and body, hyperplastic polyps in the sigmoid colon and adenocarcinoma within the mass identified approximately 6 cm from the anal verge.  She underwent CT staging on 06/30/2019 revealing extensive mural thickening in the proximal rectum with several prominent but nonenlarged mesorectal nodes, and a few scattered 1 to 3 mm peripheral pulmonary nodules that were nonspecific and favored to be benign.  She underwent an MRI of the pelvis on 07/17/2019 which did not identify adenopathy and her tumor was 8.8 cm from the internal anal sphincter measuring 12.2 cm and classified as T2 N0.  She has met with Dr. Marcello Moores who recommends neoadjuvant treatment because of the concern for the mesorectal nodes, and has also met with Dr. Maylon Peppers.  She is seen today via my chart to discuss neoadjuvant chemoradiation.      PREVIOUS RADIATION THERAPY: No   PAST MEDICAL HISTORY: History reviewed. No pertinent past medical history.     PAST SURGICAL HISTORY: Past Surgical History:  Procedure Laterality Date   TUBAL LIGATION  2003      FAMILY HISTORY:  Family History  Problem Relation Age of Onset   Bladder Cancer Maternal Grandfather    Colon cancer Neg Hx    Esophageal cancer Neg Hx    Rectal cancer Neg Hx    Stomach cancer Neg Hx      SOCIAL HISTORY:  reports that she has quit smoking. Her smoking use included cigarettes. She smoked 1.00 pack per day. She has never used smokeless tobacco. She reports previous alcohol use of about 15.0 standard drinks of alcohol per week. She reports that she does not use drugs.   ALLERGIES: Patient has no known allergies.   MEDICATIONS:  Current Outpatient Medications  Medication Sig Dispense Refill   capecitabine (XELODA) 500 MG tablet Take 2 tablets (1,000 mg total) by mouth 2 (two) times daily after a meal. Take Monday through Friday during radiation. 120 tablet 0   loperamide (IMODIUM) 2 MG capsule Take 2 at diarrhea onset, then 1 every 2hr until 12hrs with no BM. May take 2 every 4hrs at night. If diarrhea recurs repeat. 100 capsule 1   LORazepam (ATIVAN) 0.5 MG tablet Take 1 tablet (0.5 mg total) by mouth every 6 (six) hours as needed (Nausea or vomiting). 30 tablet 0   omeprazole (PRILOSEC) 20 MG capsule Take 1 capsule (20 mg total) by mouth 2 (two) times daily before a meal. (Patient taking differently: Take 20 mg by mouth daily. ) 180 capsule 1   ondansetron (ZOFRAN) 8 MG tablet Take 1 tablet (8 mg  total) by mouth 2 (two) times daily as needed (Nausea or vomiting). 30 tablet 1   prochlorperazine (COMPAZINE) 10 MG tablet Take 1 tablet (10 mg total) by mouth every 6 (six) hours as needed (Nausea or vomiting). 30 tablet 1   No current facility-administered medications for this encounter.      REVIEW OF SYSTEMS: On review of systems, the patient reports that she is doing well overall.  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  She reports intermittent episodes of rectal bleeding following bowel movements, and increased mucus production from the rectum she denies any chest pain, shortness of breath, cough, fevers, chills, night sweats, unintended weight changes. She denies any  bladder disturbances, and denies abdominal pain, nausea or vomiting.  She denies any new musculoskeletal or joint aches or pains. A complete review of systems is obtained and is otherwise negative.     PHYSICAL EXAM:  Wt Readings from Last 3 Encounters:  07/23/19 95 lb (43.1 kg)  07/22/19 95 lb 12.8 oz (43.5 kg)  07/13/19 96 lb 2 oz (43.6 kg)   Pain Assessment Pain Score: 0-No pain/10 In general this is a well appearing Caucasian female in no acute distress.  She's alert and oriented x4 and appropriate throughout the examination. Cardiopulmonary assessment is negative for acute distress and she exhibits normal effort.     ECOG = 1  0 - Asymptomatic (Fully active, able to carry on all predisease activities without restriction)  1 - Symptomatic but completely ambulatory (Restricted in physically strenuous activity but ambulatory and able to carry out work of a light or sedentary nature. For example, light housework, office work)  2 - Symptomatic, <50% in bed during the day (Ambulatory and capable of all self care but unable to carry out any work activities. Up and about more than 50% of waking hours)  3 - Symptomatic, >50% in bed, but not bedbound (Capable of only limited self-care, confined to bed or chair 50% or more of waking hours)  4 - Bedbound (Completely disabled. Cannot carry on any self-care. Totally confined to bed or chair)  5 - Death   Eustace Pen MM, Creech RH, Tormey DC, et al. [phone]). "Toxicity and response criteria of the [facility]". [place] Oncol. 5 (6):  649-55    LABORATORY DATA:  Lab Results  Component Value Date   WBC 6.7 06/26/2019   HGB 14.1 06/26/2019   HCT 43.1 06/26/2019   MCV 94.1 06/26/2019   PLT 242 06/26/2019   Lab Results  Component Value Date   NA 139 06/26/2019   K 3.5 06/26/2019   CL 104 06/26/2019   CO2 27 06/26/2019   Lab Results  Component Value Date   ALT 12 06/26/2019   AST 21 06/26/2019   ALKPHOS 53 06/26/2019   BILITOT 0.4 06/26/2019      RADIOGRAPHY: Ct Chest W Contrast  Result Date: 07/01/2019 CLINICAL DATA:  48 year old female with 7 month history of blood in stool and low rectal pressure. Weight loss. New rectal mass noted on recent colonoscopy. EXAM: CT CHEST, ABDOMEN, AND PELVIS WITH CONTRAST TECHNIQUE: Multidetector CT imaging of the chest, abdomen and pelvis was performed following the standard protocol during bolus administration of intravenous  contrast. CONTRAST:  14m OMNIPAQUE IOHEXOL 300 MG/ML  SOLN COMPARISON:  No priors. FINDINGS: CT CHEST FINDINGS Cardiovascular: Heart size is normal. There is no significant pericardial fluid, thickening or pericardial calcification. No atherosclerotic calcifications are noted in the thoracic aorta or the coronary arteries. Mediastinum/Nodes: No pathologically enlarged mediastinal or hilar lymph nodes. Several densely calcified mediastinal and right hilar lymph nodes are incidentally noted. Esophagus is unremarkable in appearance. No axillary lymphadenopathy. Lungs/Pleura: Small calcified granuloma in the right lower lobe. A few scattered 1-3 mm pulmonary nodules are noted in the periphery of the lungs bilaterally, nonspecific, but favored to be benign. No other suspicious appearing pulmonary nodules or masses are noted. No acute consolidative airspace disease. No pleural effusions. Musculoskeletal: There are no aggressive appearing lytic or blastic lesions noted in the visualized portions of the skeleton. CT ABDOMEN PELVIS FINDINGS Hepatobiliary: No suspicious  cystic or solid hepatic lesions. No intra or extrahepatic biliary ductal dilatation. A few scattered calcified granulomas are incidentally noted. Gallbladder is normal in appearance. Pancreas: No pancreatic mass. No pancreatic ductal dilatation. No pancreatic or peripancreatic fluid collections or inflammatory changes. Spleen: Calcified granulomas in the spleen. Adrenals/Urinary Tract: Bilateral kidneys and adrenal glands are normal in appearance. No hydroureteronephrosis. Urinary bladder is normal in appearance. Stomach/Bowel: Normal appearance of the stomach. No pathologic dilatation of small bowel or colon. Mural thickening noted in the proximal rectum, best visualized on axial image 101 of series 2, likely reflective of the recently diagnosed rectal mass. No definite extension of this soft tissue into the mesorectal fat. The appendix is not confidently identified and may be surgically absent. Regardless, there are no inflammatory changes noted adjacent to the cecum to suggest the presence of an acute appendicitis at this time. Vascular/Lymphatic: No significant atherosclerotic disease, aneurysm or dissection noted in the abdominal or pelvic vasculature. Circumaortic left renal vein (normal anatomical variant) incidentally noted. No lymphadenopathy identified in the abdomen or pelvis. Several nonenlarged mesorectal lymph nodes are noted measuring up to 5 mm in short axis (axial image 97 of series 2). Reproductive: Uterus and ovaries are unremarkable in appearance. Other: No significant volume of ascites.  No pneumoperitoneum. Musculoskeletal: There are no aggressive appearing lytic or blastic lesions noted in the visualized portions of the skeleton. IMPRESSION: 1. Extensive mural thickening in the proximal rectum compatible with the recently diagnosed rectal mass. There are several prominent but nonenlarged mesorectal lymph nodes associated with this which are nonspecific, but concerning for potential nodal  disease. No other sites of distal metastatic disease confidently identified elsewhere in the chest, abdomen or pelvis. 2. A few scattered 1-3 mm peripheral predominant pulmonary nodules. These are nonspecific, but strongly favored to be benign. Attention at time of routine follow-up is recommended to ensure stability. 3. Old granulomatous disease, as above. Electronically Signed   By: DVinnie LangtonM.D.   On: 07/01/2019 08:14   Mr Pelvis W Wo Contrast  Result Date: 07/17/2019 CLINICAL DATA:  Malignant neoplasm of rectum. EXAM: MRI PELVIS WITHOUT AND WITH CONTRAST TECHNIQUE: Multiplanar multisequence MR imaging of the pelvis was performed both before and after administration of intravenous contrast. Small amount of [place]agel was administered per rectum to optimize tumor evaluation. CONTRAST:  4 cc Gadavist COMPARISON:  CT 06/30/2019 FINDINGS: TUMOR LOCATION Tumor distance from Anal Verge/Skin Surface: On the order of 12.2 cm, including on 18/2. Consistent with a high rectal to rectosigmoid junction lesion. Tumor distance from Internal Anal Sphincter: 8.8 cm, including on 24/4. TUMOR DESCRIPTION Circumferential Extent: Circumferential, including  on 22/4. Tumor Length: 4.8 cm on 16/2 T - CATEGORY Extension through Muscularis Propria: No definite extension through the muscularis propria. On some images, apparent discontinuity of the T2 hypointense muscularis propria is felt to be due to obliquity. Example series 4. Extramural Vascular Invasion/Tumor Thrombus: No Invasion of Anterior Peritoneal Reflection: No Involvement of Adjacent Organs or Pelvic Sidewall: No Levator Ani Involvement: No N - CATEGORY Mesorectal Lymph Nodes >=53m: None=N0 Extra-mesorectal Lymphadenopathy: No-Nodes within the sigmoid mesocolon measure up to 5 mm on 45/3 and are indeterminate. Other: Normal urinary bladder. No bowel obstruction. No significant free pelvic fluid. Normal uterus for age. IMPRESSION: Rectal adenocarcinoma T stage: 2  Rectal adenocarcinoma N stage:  0 Distance from tumor to the internal anal sphincter is 8.8 cm. Electronically Signed   By: KAbigail MiyamotoM.D.   On: 07/17/2019 13:20   Ct Abdomen Pelvis W Contrast  Result Date: 07/01/2019 CLINICAL DATA:  48year old female with 7 month history of blood in stool and low rectal pressure. Weight loss. New rectal mass noted on recent colonoscopy. EXAM: CT CHEST, ABDOMEN, AND PELVIS WITH CONTRAST TECHNIQUE: Multidetector CT imaging of the chest, abdomen and pelvis was performed following the standard protocol during bolus administration of intravenous contrast. CONTRAST:  105mOMNIPAQUE IOHEXOL 300 MG/ML  SOLN COMPARISON:  No priors. FINDINGS: CT CHEST FINDINGS Cardiovascular: Heart size is normal. There is no significant pericardial fluid, thickening or pericardial calcification. No atherosclerotic calcifications are noted in the thoracic aorta or the coronary arteries. Mediastinum/Nodes: No pathologically enlarged mediastinal or hilar lymph nodes. Several densely calcified mediastinal and right hilar lymph nodes are incidentally noted. Esophagus is unremarkable in appearance. No axillary lymphadenopathy. Lungs/Pleura: Small calcified granuloma in the right lower lobe. A few scattered 1-3 mm pulmonary nodules are noted in the periphery of the lungs bilaterally, nonspecific, but favored to be benign. No other suspicious appearing pulmonary nodules or masses are noted. No acute consolidative airspace disease. No pleural effusions. Musculoskeletal: There are no aggressive appearing lytic or blastic lesions noted in the visualized portions of the skeleton. CT ABDOMEN PELVIS FINDINGS Hepatobiliary: No suspicious cystic or solid hepatic lesions. No intra or extrahepatic biliary ductal dilatation. A few scattered calcified granulomas are incidentally noted. Gallbladder is normal in appearance. Pancreas: No pancreatic mass. No pancreatic ductal dilatation. No pancreatic or peripancreatic  fluid collections or inflammatory changes. Spleen: Calcified granulomas in the spleen. Adrenals/Urinary Tract: Bilateral kidneys and adrenal glands are normal in appearance. No hydroureteronephrosis. Urinary bladder is normal in appearance. Stomach/Bowel: Normal appearance of the stomach. No pathologic dilatation of small bowel or colon. Mural thickening noted in the proximal rectum, best visualized on axial image 101 of series 2, likely reflective of the recently diagnosed rectal mass. No definite extension of this soft tissue into the mesorectal fat. The appendix is not confidently identified and may be surgically absent. Regardless, there are no inflammatory changes noted adjacent to the cecum to suggest the presence of an acute appendicitis at this time. Vascular/Lymphatic: No significant atherosclerotic disease, aneurysm or dissection noted in the abdominal or pelvic vasculature. Circumaortic left renal vein (normal anatomical variant) incidentally noted. No lymphadenopathy identified in the abdomen or pelvis. Several nonenlarged mesorectal lymph nodes are noted measuring up to 5 mm in short axis (axial image 97 of series 2). Reproductive: Uterus and ovaries are unremarkable in appearance. Other: No significant volume of ascites.  No pneumoperitoneum. Musculoskeletal: There are no aggressive appearing lytic or blastic lesions noted in the visualized portions of the skeleton. IMPRESSION: 1.  Extensive mural thickening in the proximal rectum compatible with the recently diagnosed rectal mass. There are several prominent but nonenlarged mesorectal lymph nodes associated with this which are nonspecific, but concerning for potential nodal disease. No other sites of distal metastatic disease confidently identified elsewhere in the chest, abdomen or pelvis. 2. A few scattered 1-3 mm peripheral predominant pulmonary nodules. These are nonspecific, but strongly favored to be benign. Attention at time of routine follow-up  is recommended to ensure stability. 3. Old granulomatous disease, as above. Electronically Signed   By: Vinnie Langton M.D.   On: 07/01/2019 08:14       IMPRESSION/PLAN: 1. At least Stage IIcT2N0M0 adenocarcinoma of the rectum.  Dr. Lisbeth Renshaw discusses the findings on the patient's colonoscopy as well as imaging findings to date.  He reviews the concerns for the possibility of mesial rectal node involvement, and the rationale to consider chemoradiation in the neoadjuvant setting.  We discussed the rationale for chemoradiation followed by surgical resection and the possibility of additional systemic chemotherapy.  We reviewed the risks, benefits, short and long-term effects of treatment as well as the logistics and delivery.  The patient is interested in proceeding, and will be scheduled for simulation on Wednesday, September 2 with the intention of beginning her treatment on 08/04/2019.  She would technically finish her treatment on October 16 barring no delays.  We will follow-up with Dr. Maylon Peppers and Dr. Marcello Moores regarding these plans as well.  This encounter was provided by telemedicine platform MyChart.  The patient has given verbal consent for this type of encounter and has been advised to only accept a meeting of this type in a secure network environment. The time spent during this encounter was 60 minutes. The attendants for this meeting include Blenda Nicely, RN, Dr. Lisbeth Renshaw, Hayden Pedro  and [name].  During the encounter,  Blenda Nicely, RN, Dr. Lisbeth Renshaw, and Hayden Pedro were located at [facility] Radiation Oncology Department.  Sarah [name] was located at home.    The above documentation reflects my direct findings during this shared patient visit. Please see the separate note by Dr. Lisbeth Renshaw on this date for the remainder of the patient's plan of care.    Carola Rhine, PAC

## 2019-07-23 NOTE — Telephone Encounter (Signed)
Oral Oncology Pharmacist Encounter  Received new prescription for Xeloda (capecitabine) for the neoadjuvant treatment of stage II rectal cancer in conjunction with radiation, planned duration 5 1/2 week course (28 treatment days over 38 calendar days).  Xeloda is planned to be administered at ~ 720 mg/m2 (1000 mg) by mouth twice daily on days of radiation only  Labs from 06/26/19 assessed, OK for treatment initiation. SCr=0.72, round to 0.8 per institutional policy, est CrCl ~ 55 mL/min, no dose adjustment per manufacturer with renal function > 50 mL/min   Current medication list in Epic reviewed, moderate DDI with Xeloda identified:  Category C interaction with Xeloda and omeprazole: conflicting retrospective data with one analysis showing decrease in OS and PFS when Xeloda was used for adjuvant treatment of gastric cancer concurrently with PPI, another study showing no effect on important efficacy outcomes. Patient will be screened for ability to discontinue PPI therapy or if she has even started. If unable to d/c PPI, no change to treatment is indicated.  Prescription has been e-scribed to the [facility] for benefits analysis and approval by MD.  Oral Oncology Clinic will continue to follow for insurance authorization, copayment issues, initial counseling and start date.  Johny Drilling, PharmD, BCPS, BCOP  07/23/2019 10:53 AM Oral Oncology Clinic [phone]

## 2019-07-28 NOTE — Telephone Encounter (Signed)
Thank you.  Dr. Ridley Dileo  

## 2019-07-28 NOTE — Telephone Encounter (Signed)
Oral Chemotherapy Pharmacist Encounter   I spoke with patient for overview of: Xeloda (capecitabine) for the neoadjuvant treatment of stage II rectal cancer in conjunction with radiation, planned duration 5 1/2 week course (28 treatment days over 38 calendar days).   Counseled patient on administration, dosing, side effects, monitoring, drug-food interactions, safe handling, storage, and disposal.  Patient will take Xeloda 500mg  tablets, 2 tablets (1000mg ) by mouth in AM and 2 tabs (1000mg ) by mouth in PM, within 30 minutes of finishing meals, on days of radiation only.  Xeloda and radiation start date: TBD, CT simulation appointment on 07/29/19  Adverse effects of Xeloda include but are not limited to: fatigue, decreased blood counts, GI upset, diarrhea, mouth sores, and hand-foot syndrome.  Patient has anti-emetic on hand and knows to take it if nausea develops.   Patient has anti diarrheal on hand and will alert the office of 4 or more loose stools above baseline.   Reviewed with patient importance of keeping a medication schedule and plan for any missed doses.  Medication reconciliation performed and medication/allergy list updated.  Insurance authorization for Xeloda was not required at this time. Patient has active Medicaid prescription drug coverage. Test claim at the pharmacy revealed copayment $3 for 1st fill of Xeloda. Patient will pick this up from the [facility] on 07/29/19. She understands to wait until her 1st day of radiation to start her Xeloda  Patient informed the pharmacy will reach out 5-7 days prior to needing next fill of Xeloda to coordinate continued medication acquisition to prevent break in therapy.  All questions answered.  Ms. Baldner voiced understanding and appreciation.   Patient knows to call the office with questions or concerns.  Johny Drilling, PharmD, BCPS, BCOP  07/28/2019  9:27 AM Oral Oncology Clinic [phone]

## 2019-07-29 ENCOUNTER — Other Ambulatory Visit: Payer: Self-pay

## 2019-07-29 ENCOUNTER — Ambulatory Visit
Admission: RE | Admit: 2019-07-29 | Discharge: 2019-07-29 | Disposition: A | Discharge status: Home / Self Care | Payer: Medicaid Other | Source: Ambulatory Visit | Attending: Radiation Oncology | Admitting: Radiation Oncology

## 2019-07-29 DIAGNOSIS — C20 Malignant neoplasm of rectum: Secondary | ICD-10-CM | POA: Diagnosis not present

## 2019-07-29 DIAGNOSIS — Z510 Encounter for antineoplastic radiation therapy: Secondary | ICD-10-CM | POA: Insufficient documentation

## 2019-07-29 MED FILL — CAPECITABINE 500 MG TABS: 500 | 28 days supply | Qty: 88 | Fill #0

## 2019-07-29 NOTE — Telephone Encounter (Signed)
Oral Oncology Patient Advocate Encounter  Confirmed with [place] that Xeloda was picked up on 07/29/19 with a $3 copay.   [place] [facility] [place] Phone [phone] Fax [phone] 07/29/2019   4:03 PM

## 2019-07-30 NOTE — Progress Notes (Signed)
  Radiation Oncology         [phone] ________________________________  Name: Sarah [name] MRN: [id number]  Date: 07/29/2019  DOB: [birth year]   SIMULATION AND TREATMENT PLANNING NOTE  DIAGNOSIS:     ICD-10-CM   1. Rectal adenocarcinoma ([place])  C20      The patient presented for simulation for the patient's upcoming course of radiation for the diagnosis of rectal cancer. The patient was placed in a supine position. A customized vac-lock bag was constructed to aid in patient immobilization on. This complex treatment device will be used on a daily basis during the treatment. In this fashion a CT scan was obtained through the pelvic region and the isocenter was placed near midline within the pelvis. Surface markings were placed.  The patient's imaging was loaded into the radiation treatment planning system. The patient will initially be planned to receive a course of radiation to a dose of 45 Gy. This will be accomplished in 25 fractions at 1.8 gray per fraction. This initial treatment will correspond to a 3-D conformal technique. The target has been contoured in addition to the rectum, bladder and femoral heads. Dose volume histograms of each of these structures have been requested and these will be carefully reviewed as part of the 3-D conformal treatment planning process. To accomplish this initial treatment, 4 customized blocks have been designed for this purpose. Each of these 4 complex treatment devices will be used on a daily basis during the initial course of the treatment. It is anticipated that the patient will then receive a boost for an additional 5.4 Gy. The anticipated total dose therefore will be 50.4 Gy.    Special treatment procedure The patient will receive chemotherapy during the course of radiation treatment. The patient may experience increased or overlapping toxicity due to this combined-modality approach and the patient will be monitored for such problems. This may include  extra lab work as necessary. This therefore constitutes a special treatment procedure.    ________________________________  Jodelle Gross, MD, PhD

## 2019-07-30 NOTE — Progress Notes (Signed)
  Radiation Oncology         [phone]) [phone] ________________________________  Name: Sarah [name] MRN: [id number]  Date: 07/29/2019  DOB: 1971/06/15  Optical Surface Tracking Plan:  Since intensity modulated radiotherapy (IMRT) and 3D conformal radiation treatment methods are predicated on accurate and precise positioning for treatment, intrafraction motion monitoring is medically necessary to ensure accurate and safe treatment delivery.  The ability to quantify intrafraction motion without excessive ionizing radiation dose can only be performed with optical surface tracking. Accordingly, surface imaging offers the opportunity to obtain 3D measurements of patient position throughout IMRT and 3D treatments without excessive radiation exposure.  I am ordering optical surface tracking for this patient's upcoming course of radiotherapy. ________________________________  Kyung Rudd, MD 07/30/2019 6:04 PM    Reference:   Particia Jasper, et al. Surface imaging-based analysis of intrafraction motion for breast radiotherapy patients.Journal of [place], n. 6, nov. 2014. ISSN [id number].   Available at: <http://www.jacmp.org/index.php/jacmp/article/view/4957>.

## 2019-07-30 NOTE — Telephone Encounter (Signed)
Thank you for your help.   Dr. Maylon Peppers

## 2019-07-31 DIAGNOSIS — Z510 Encounter for antineoplastic radiation therapy: Secondary | ICD-10-CM | POA: Diagnosis not present

## 2019-08-04 ENCOUNTER — Other Ambulatory Visit: Payer: Self-pay

## 2019-08-04 ENCOUNTER — Ambulatory Visit
Admission: RE | Admit: 2019-08-04 | Discharge: 2019-08-04 | Disposition: A | Discharge status: Home / Self Care | Payer: Medicaid Other | Source: Ambulatory Visit | Attending: Radiation Oncology | Admitting: Radiation Oncology

## 2019-08-04 DIAGNOSIS — Z510 Encounter for antineoplastic radiation therapy: Secondary | ICD-10-CM | POA: Diagnosis not present

## 2019-08-05 ENCOUNTER — Ambulatory Visit
Admission: RE | Admit: 2019-08-05 | Discharge: 2019-08-05 | Disposition: A | Discharge status: Home / Self Care | Payer: Medicaid Other | Source: Ambulatory Visit | Attending: Radiation Oncology | Admitting: Radiation Oncology

## 2019-08-05 ENCOUNTER — Other Ambulatory Visit: Payer: Self-pay

## 2019-08-05 DIAGNOSIS — Z510 Encounter for antineoplastic radiation therapy: Secondary | ICD-10-CM | POA: Diagnosis not present

## 2019-08-06 ENCOUNTER — Ambulatory Visit
Admission: RE | Admit: 2019-08-06 | Discharge: 2019-08-06 | Disposition: A | Discharge status: Home / Self Care | Payer: Medicaid Other | Source: Ambulatory Visit | Attending: Radiation Oncology | Admitting: Radiation Oncology

## 2019-08-06 DIAGNOSIS — Z510 Encounter for antineoplastic radiation therapy: Secondary | ICD-10-CM | POA: Diagnosis not present

## 2019-08-07 ENCOUNTER — Ambulatory Visit
Admission: RE | Admit: 2019-08-07 | Discharge: 2019-08-07 | Disposition: A | Discharge status: Home / Self Care | Payer: Medicaid Other | Source: Ambulatory Visit | Attending: Radiation Oncology | Admitting: Radiation Oncology

## 2019-08-07 ENCOUNTER — Other Ambulatory Visit: Payer: Self-pay

## 2019-08-07 DIAGNOSIS — Z510 Encounter for antineoplastic radiation therapy: Secondary | ICD-10-CM | POA: Diagnosis not present

## 2019-08-07 NOTE — Progress Notes (Signed)
Pt here for patient teaching.  Pt given Radiation and You booklet.  Reviewed areas of pertinence such as diarrhea, fatigue, hair loss, sexual and fertility changes, skin changes and urinary and bladder changes . Pt able to give teach back of to pat skin, use unscented/gentle soap, use baby wipes, have Imodium on hand, drink plenty of water and sitz bath,avoid applying anything to skin within 4 hours of treatment. Pt verbalized understanding of information given and will contact nursing with any questions or concerns.    Masie Bermingham M. Berkeley Vanaken RN, BSN      

## 2019-08-10 ENCOUNTER — Ambulatory Visit
Admission: RE | Admit: 2019-08-10 | Discharge: 2019-08-10 | Disposition: A | Discharge status: Home / Self Care | Payer: Medicaid Other | Source: Ambulatory Visit | Attending: Radiation Oncology | Admitting: Radiation Oncology

## 2019-08-10 ENCOUNTER — Other Ambulatory Visit: Payer: Self-pay

## 2019-08-10 DIAGNOSIS — Z510 Encounter for antineoplastic radiation therapy: Secondary | ICD-10-CM | POA: Diagnosis not present

## 2019-08-11 ENCOUNTER — Ambulatory Visit
Admission: RE | Admit: 2019-08-11 | Discharge: 2019-08-11 | Disposition: A | Discharge status: Home / Self Care | Payer: Medicaid Other | Source: Ambulatory Visit | Attending: Radiation Oncology | Admitting: Radiation Oncology

## 2019-08-11 ENCOUNTER — Other Ambulatory Visit: Payer: Self-pay

## 2019-08-11 DIAGNOSIS — Z510 Encounter for antineoplastic radiation therapy: Secondary | ICD-10-CM | POA: Diagnosis not present

## 2019-08-12 ENCOUNTER — Inpatient Hospital Stay: Payer: Medicaid Other | Attending: Hematology

## 2019-08-12 ENCOUNTER — Telehealth: Payer: Self-pay | Admitting: Hematology

## 2019-08-12 ENCOUNTER — Other Ambulatory Visit: Payer: Self-pay

## 2019-08-12 ENCOUNTER — Ambulatory Visit
Admission: RE | Admit: 2019-08-12 | Discharge: 2019-08-12 | Disposition: A | Discharge status: Home / Self Care | Payer: Medicaid Other | Source: Ambulatory Visit | Attending: Radiation Oncology | Admitting: Radiation Oncology

## 2019-08-12 ENCOUNTER — Inpatient Hospital Stay (HOSPITAL_BASED_OUTPATIENT_CLINIC_OR_DEPARTMENT_OTHER): Payer: Medicaid Other | Admitting: Hematology

## 2019-08-12 ENCOUNTER — Encounter: Payer: Self-pay | Admitting: Hematology

## 2019-08-12 VITALS — BP 116/82 | HR 88 | Temp 98.2°F | Resp 18 | Ht 63.5 in | Wt 97.3 lb

## 2019-08-12 DIAGNOSIS — C19 Malignant neoplasm of rectosigmoid junction: Secondary | ICD-10-CM | POA: Diagnosis present

## 2019-08-12 DIAGNOSIS — K921 Melena: Secondary | ICD-10-CM | POA: Diagnosis not present

## 2019-08-12 DIAGNOSIS — Z510 Encounter for antineoplastic radiation therapy: Secondary | ICD-10-CM | POA: Diagnosis not present

## 2019-08-12 DIAGNOSIS — G893 Neoplasm related pain (acute) (chronic): Secondary | ICD-10-CM | POA: Diagnosis not present

## 2019-08-12 DIAGNOSIS — T451X5A Adverse effect of antineoplastic and immunosuppressive drugs, initial encounter: Secondary | ICD-10-CM

## 2019-08-12 DIAGNOSIS — D701 Agranulocytosis secondary to cancer chemotherapy: Secondary | ICD-10-CM | POA: Insufficient documentation

## 2019-08-12 DIAGNOSIS — Z923 Personal history of irradiation: Secondary | ICD-10-CM | POA: Diagnosis not present

## 2019-08-12 DIAGNOSIS — Z9221 Personal history of antineoplastic chemotherapy: Secondary | ICD-10-CM | POA: Diagnosis not present

## 2019-08-12 DIAGNOSIS — R110 Nausea: Secondary | ICD-10-CM | POA: Insufficient documentation

## 2019-08-12 DIAGNOSIS — Z87891 Personal history of nicotine dependence: Secondary | ICD-10-CM | POA: Insufficient documentation

## 2019-08-12 DIAGNOSIS — C20 Malignant neoplasm of rectum: Secondary | ICD-10-CM | POA: Diagnosis not present

## 2019-08-12 DIAGNOSIS — Z720 Tobacco use: Secondary | ICD-10-CM

## 2019-08-12 DIAGNOSIS — Z79899 Other long term (current) drug therapy: Secondary | ICD-10-CM | POA: Insufficient documentation

## 2019-08-12 LAB — CMP (CANCER CENTER ONLY)
ALT: 8 U/L (ref 0–44)
AST: 21 U/L (ref 15–41)
Albumin: 4 g/dL (ref 3.5–5.0)
Alkaline Phosphatase: 44 U/L (ref 38–126)
Anion gap: 6 (ref 5–15)
BUN: 17 mg/dL (ref 6–20)
CO2: 28 mmol/L (ref 22–32)
Calcium: 8.9 mg/dL (ref 8.9–10.3)
Chloride: 102 mmol/L (ref 98–111)
Creatinine: 0.76 mg/dL (ref 0.44–1.00)
GFR, Est AFR Am: >60 mL/min (ref >60)
GFR, Estimated: >60 mL/min (ref >60)
Glucose, Bld: 101 mg/dL — ABNORMAL HIGH (ref 70–99)
Potassium: 4.3 mmol/L (ref 3.5–5.1)
Sodium: 136 mmol/L (ref 135–145)
Total Bilirubin: 0.4 mg/dL (ref 0.3–1.2)
Total Protein: 6.9 g/dL (ref 6.5–8.1)

## 2019-08-12 LAB — CBC WITH DIFFERENTIAL (CANCER CENTER ONLY)
Abs Immature Granulocytes: 0.03 10*3/uL (ref 0.00–0.07)
Basophils Absolute: 0 10*3/uL (ref 0.0–0.1)
Basophils Relative: 1 %
Eosinophils Absolute: 0.1 10*3/uL (ref 0.0–0.5)
Eosinophils Relative: 3 %
HCT: 41.9 % (ref 36.0–46.0)
Hemoglobin: 14 g/dL (ref 12.0–15.0)
Immature Granulocytes: 1 %
Lymphocytes Relative: 30 %
Lymphs Abs: 1.2 10*3/uL (ref 0.7–4.0)
MCH: 30.7 pg (ref 26.0–34.0)
MCHC: 33.4 g/dL (ref 30.0–36.0)
MCV: 91.9 fL (ref 80.0–100.0)
Monocytes Absolute: 0.4 10*3/uL (ref 0.1–1.0)
Monocytes Relative: 9 %
Neutro Abs: 2.2 10*3/uL (ref 1.7–7.7)
Neutrophils Relative %: 56 %
Platelet Count: 235 10*3/uL (ref 150–400)
RBC: 4.56 MIL/uL (ref 3.87–5.11)
RDW: 12 % (ref 11.5–15.5)
WBC Count: 3.9 10*3/uL — ABNORMAL LOW (ref 4.0–10.5)
nRBC: 0 % (ref 0.0–0.2)

## 2019-08-12 LAB — MAGNESIUM: Magnesium: 2.1 mg/dL (ref 1.7–2.4)

## 2019-08-12 NOTE — Telephone Encounter (Signed)
Gave avs and calendar ° °

## 2019-08-12 NOTE — Progress Notes (Signed)
[place] OFFICE PROGRESS NOTE  Patient Care Team: Karleen Hampshire., MD as PCP - General (Internal Medicine) Cordelia Poche, RN as Oncology Nurse Navigator Tish Men, MD as Medical Oncologist (Oncology)  HEME/ONC OVERVIEW: 1. 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 Marcello Moores, who plans to move forward with surgery approximately 1 month after completing chemoRT -No imaging studies indicated prior to surgery unless the patient develops any suspicious symptoms  -PRN anti-emetics: Zofran, Compazine, Ativan  Chemotherapy-associated leukopenia -Secondary to chemotherapy -WBC 3.9k with ANC 2200, lower than the last visit  -Patient denies any symptoms of infection -We will monitor for now; no indication for dose adjustment  Hematochezia -Most likely secondary to rectosigmoid cancer -Trace, stable overall -Hgb stable -I counseled the patient on the importance of maintaining soft BM's, and if she develops worsening hematochezia, she is instructed to contact her gastroenterologist ASAP   Tobacco abuse -Quit smoking in early 06/2019; no relapse since then -I counseled the patient on the importance of  abstinence from tobacco products, especially given its impact on wound healing   No orders of the defined types were placed in this encounter.  All questions were answered. The patient knows to call the clinic with any problems, questions or concerns. No barriers to learning was detected.  Return in 2 weeks for labs and clinic follow-up.  Tish Men, MD 08/12/2019 10:46 AM  CHIEF COMPLAINT: "I am doing okay so far"  INTERVAL HISTORY: Sarah [name] returns to clinic for follow-up of adenocarcinoma of the rectosigmoid colon on neoadjuvant chemoradiation.  Patient reports that she started chemoradiation on 08/04/2019, and has tolerated well so far.  She denies any abdominal pain, nausea, vomiting, or diarrhea.  She still has occasional trace hematochezia mixed with mucus in the bowel movement, but her bowel movement is soft, and she has been trying to increase her dietary fiber intake.  She is eating and drinking without any limitation.  She denies any other complaint today.  SUMMARY OF ONCOLOGIC HISTORY: Oncology History  Rectal adenocarcinoma ([place])  06/25/2019 Initial Diagnosis   Colon cancer ([place])   06/25/2019 Procedure   Colonoscopy: - Hemorrhoids found on perianal exam. - One 9 mm polyp in the sigmoid colon, removed with a cold snare. Resected and retrieved. - Malignant partially obstructing tumor in the recto-sigmoid colon. Biopsied. Tattooed. - Non-bleeding internal hemorrhoids. - The descending colon and distal transverse colon are normal.   06/30/2019 Imaging   CT chest, abdomen and pelvis w/ contrast:  IMPRESSION: 1. Extensive mural thickening in the proximal rectum compatible with the recently diagnosed rectal mass. There are several prominent but nonenlarged mesorectal lymph nodes associated with this which are nonspecific, but concerning for potential nodal disease. No other sites of distal metastatic disease confidently identified elsewhere in the chest,  abdomen or pelvis. 2. A  few scattered 1-3 mm peripheral predominant pulmonary nodules. These are nonspecific, but strongly favored to be benign. Attention at time of routine follow-up is recommended to ensure stability. 3. Old granulomatous disease, as above.   07/17/2019 Imaging   MRI pelvis w/ contrast:  IMPRESSION: Rectal adenocarcinoma T stage: 2   Rectal adenocarcinoma N stage:  0   Distance from tumor to the internal anal sphincter is 8.8 cm.   07/22/2019 Cancer Staging   Staging form: Colon and Rectum - Neuroendocine Tumors, AJCC 8th Edition - Clinical: Stage IIA (cT2, cN0, cM0) - Signed by Tish Men, MD on 07/22/2019   08/04/2019 -  Chemotherapy   The patient had [No matching medication found in this treatment plan]  for chemotherapy treatment.      REVIEW OF SYSTEMS:   Constitutional: ( - ) fevers, ( - )  chills , ( - ) night sweats Eyes: ( - ) blurriness of vision, ( - ) double vision, ( - ) watery eyes Ears, nose, mouth, throat, and face: ( - ) mucositis, ( - ) sore throat Respiratory: ( - ) cough, ( - ) dyspnea, ( - ) wheezes Cardiovascular: ( - ) palpitation, ( - ) chest discomfort, ( - ) lower extremity swelling Gastrointestinal:  ( - ) nausea, ( - ) heartburn, ( + ) change in bowel habits Skin: ( - ) abnormal skin rashes Lymphatics: ( - ) new lymphadenopathy, ( - ) easy bruising Neurological: ( - ) numbness, ( - ) tingling, ( - ) new weaknesses Behavioral/Psych: ( - ) mood change, ( - ) new changes  All other systems were reviewed with the patient and are negative.  I have reviewed the past medical history, past surgical history, social history and family history with the patient and they are unchanged from previous note.  ALLERGIES:  has No Known Allergies.  MEDICATIONS:  Current Outpatient Medications  Medication Sig Dispense Refill  . capecitabine (XELODA) 500 MG tablet Take 2 tablets (1,000 mg total) by mouth 2 (two) times daily after a meal. Take Monday through Friday during  radiation. 120 tablet 0  . loperamide (IMODIUM) 2 MG capsule Take 2 at diarrhea onset, then 1 every 2hr until 12hrs with no BM. May take 2 every 4hrs at night. If diarrhea recurs repeat. 100 capsule 1  . LORazepam (ATIVAN) 0.5 MG tablet Take 1 tablet (0.5 mg total) by mouth every 6 (six) hours as needed (Nausea or vomiting). 30 tablet 0  . omeprazole (PRILOSEC) 20 MG capsule Take 1 capsule (20 mg total) by mouth 2 (two) times daily before a meal. (Patient taking differently: Take 20 mg by mouth daily. ) 180 capsule 1  . ondansetron (ZOFRAN) 8 MG tablet Take 1 tablet (8 mg total) by mouth 2 (two) times daily as needed (Nausea or vomiting). 30 tablet 1  . prochlorperazine (COMPAZINE) 10 MG tablet Take 1 tablet (10 mg total) by mouth every 6 (six) hours as needed (Nausea or vomiting). 30 tablet 1   No current facility-administered medications for this visit.     PHYSICAL EXAMINATION: ECOG PERFORMANCE STATUS: 1 - Symptomatic but completely ambulatory  Today's Vitals   08/12/19 1006 08/12/19 1010  BP: 116/82   Pulse: 88   Resp: 18   Temp: 98.2 F (36.8 C)   TempSrc: Oral   SpO2: 100%   Weight: 97 lb 4.8 oz (44.1 kg)   Height: 5' 3.5" (1.613 m)   PainSc:  0-No  pain   Body mass index is 16.97 kg/m.  Filed Weights   08/12/19 1006  Weight: 97 lb 4.8 oz (44.1 kg)    GENERAL: alert, no distress and comfortable, thin SKIN: skin color, texture, turgor are normal, no rashes or significant lesions EYES: conjunctiva are pink and non-injected, sclera clear OROPHARYNX: no exudate, no erythema; lips, buccal mucosa, and tongue normal  NECK: supple, non-tender LUNGS: clear to auscultation with normal breathing effort HEART: regular rate & rhythm and no murmurs and no lower extremity edema ABDOMEN: soft, non-tender, non-distended, normal bowel sounds Musculoskeletal: no cyanosis of digits and no clubbing  PSYCH: alert & oriented x 3, fluent speech NEURO: no focal motor/sensory  deficits  LABORATORY DATA:  I have reviewed the data as listed    Component Value Date/Time   NA 136 08/12/2019 0948   K 4.3 08/12/2019 0948   CL 102 08/12/2019 0948   CO2 28 08/12/2019 0948   GLUCOSE 101 (H) 08/12/2019 0948   BUN 17 08/12/2019 0948   CREATININE 0.76 08/12/2019 0948   CALCIUM 8.9 08/12/2019 0948   PROT 6.9 08/12/2019 0948   ALBUMIN 4.0 08/12/2019 0948   AST 21 08/12/2019 0948   ALT 8 08/12/2019 0948   ALKPHOS 44 08/12/2019 0948   BILITOT 0.4 08/12/2019 0948   GFRNONAA >60 08/12/2019 0948   GFRAA >60 08/12/2019 0948    No results found for: SPEP, UPEP  Lab Results  Component Value Date   WBC 3.9 (L) 08/12/2019   NEUTROABS 2.2 08/12/2019   HGB 14.0 08/12/2019   HCT 41.9 08/12/2019   MCV 91.9 08/12/2019   PLT 235 08/12/2019      Chemistry      Component Value Date/Time   NA 136 08/12/2019 0948   K 4.3 08/12/2019 0948   CL 102 08/12/2019 0948   CO2 28 08/12/2019 0948   BUN 17 08/12/2019 0948   CREATININE 0.76 08/12/2019 0948      Component Value Date/Time   CALCIUM 8.9 08/12/2019 0948   ALKPHOS 44 08/12/2019 0948   AST 21 08/12/2019 0948   ALT 8 08/12/2019 0948   BILITOT 0.4 08/12/2019 0948       RADIOGRAPHIC STUDIES: I have personally reviewed the radiological images as listed below and agreed with the findings in the report. Mr Pelvis W Wo Contrast  Result Date: 07/17/2019 CLINICAL DATA:  Malignant neoplasm of rectum. EXAM: MRI PELVIS WITHOUT AND WITH CONTRAST TECHNIQUE: Multiplanar multisequence MR imaging of the pelvis was performed both before and after administration of intravenous contrast. Small amount of [place] gel was administered per rectum to optimize tumor evaluation. CONTRAST:  4 cc Gadavist COMPARISON:  CT 06/30/2019 FINDINGS: TUMOR LOCATION Tumor distance from Anal Verge/Skin Surface: On the order of 12.2 cm, including on 18/2. Consistent with a high rectal to rectosigmoid junction lesion. Tumor distance from Internal Anal  Sphincter: 8.8 cm, including on 24/4. TUMOR DESCRIPTION Circumferential Extent: Circumferential, including on 22/4. Tumor Length: 4.8 cm on 16/2 T - CATEGORY Extension through Muscularis Propria: No definite extension through the muscularis propria. On some images, apparent discontinuity of the T2 hypointense muscularis propria is felt to be due to obliquity. Example series 4. Extramural Vascular Invasion/Tumor Thrombus: No Invasion of Anterior Peritoneal Reflection: No Involvement of Adjacent Organs or Pelvic Sidewall: No Levator Ani Involvement: No N - CATEGORY Mesorectal Lymph Nodes >=34mm: None=N0 Extra-mesorectal Lymphadenopathy: No-Nodes within the sigmoid mesocolon measure up to 5 mm on 45/3 and are indeterminate. Other: Normal urinary bladder. No bowel  obstruction. No significant free pelvic fluid. Normal uterus for age. IMPRESSION: Rectal adenocarcinoma T stage: 2 Rectal adenocarcinoma N stage:  0 Distance from tumor to the internal anal sphincter is 8.8 cm. Electronically Signed   By: Abigail Miyamoto M.D.   On: 07/17/2019 13:20

## 2019-08-13 ENCOUNTER — Other Ambulatory Visit: Payer: Self-pay

## 2019-08-13 ENCOUNTER — Ambulatory Visit
Admission: RE | Admit: 2019-08-13 | Discharge: 2019-08-13 | Disposition: A | Discharge status: Home / Self Care | Payer: Medicaid Other | Source: Ambulatory Visit | Attending: Radiation Oncology | Admitting: Radiation Oncology

## 2019-08-13 DIAGNOSIS — Z510 Encounter for antineoplastic radiation therapy: Secondary | ICD-10-CM | POA: Diagnosis not present

## 2019-08-14 ENCOUNTER — Other Ambulatory Visit: Payer: Self-pay

## 2019-08-14 ENCOUNTER — Ambulatory Visit
Admission: RE | Admit: 2019-08-14 | Discharge: 2019-08-14 | Disposition: A | Discharge status: Home / Self Care | Payer: Medicaid Other | Source: Ambulatory Visit | Attending: Radiation Oncology | Admitting: Radiation Oncology

## 2019-08-14 DIAGNOSIS — Z510 Encounter for antineoplastic radiation therapy: Secondary | ICD-10-CM | POA: Diagnosis not present

## 2019-08-17 ENCOUNTER — Ambulatory Visit
Admission: RE | Admit: 2019-08-17 | Discharge: 2019-08-17 | Disposition: A | Discharge status: Home / Self Care | Payer: Medicaid Other | Source: Ambulatory Visit | Attending: Radiation Oncology | Admitting: Radiation Oncology

## 2019-08-17 ENCOUNTER — Other Ambulatory Visit: Payer: Self-pay

## 2019-08-17 DIAGNOSIS — Z510 Encounter for antineoplastic radiation therapy: Secondary | ICD-10-CM | POA: Diagnosis not present

## 2019-08-18 ENCOUNTER — Ambulatory Visit
Admission: RE | Admit: 2019-08-18 | Discharge: 2019-08-18 | Disposition: A | Discharge status: Home / Self Care | Payer: Medicaid Other | Source: Ambulatory Visit | Attending: Radiation Oncology | Admitting: Radiation Oncology

## 2019-08-18 ENCOUNTER — Other Ambulatory Visit: Payer: Self-pay

## 2019-08-18 DIAGNOSIS — Z510 Encounter for antineoplastic radiation therapy: Secondary | ICD-10-CM | POA: Diagnosis not present

## 2019-08-19 ENCOUNTER — Ambulatory Visit
Admission: RE | Admit: 2019-08-19 | Discharge: 2019-08-19 | Disposition: A | Discharge status: Home / Self Care | Payer: Medicaid Other | Source: Ambulatory Visit | Attending: Radiation Oncology | Admitting: Radiation Oncology

## 2019-08-19 ENCOUNTER — Other Ambulatory Visit: Payer: Self-pay | Admitting: Radiation Oncology

## 2019-08-19 ENCOUNTER — Other Ambulatory Visit: Payer: Self-pay

## 2019-08-19 DIAGNOSIS — Z510 Encounter for antineoplastic radiation therapy: Secondary | ICD-10-CM | POA: Diagnosis not present

## 2019-08-19 MED ORDER — HYDROCORTISONE ACETATE 25 MG RE SUPP: 25.0000 mg | Freq: Two times a day (BID) | RECTAL | 3 refills | Status: DC

## 2019-08-19 MED FILL — HYDROCORTISONE ACETATE 25 M: 25 | 6 days supply | Qty: 12 | Fill #0

## 2019-08-19 NOTE — Progress Notes (Signed)
The pt has had 12/28 fxns to the rectum and pelvis and has noticed increasing rectal pain and mucous production. On exam, she has circumferential hemorrhoids visible that are nonbleeding. They are tender to touch during examination. Her stool is otherwise soft. We discussed anusol suppositories to try to alleviate her symptoms and tucks wipes in addition to prn use of preparation h with lidocaine as well. She will let us know how these medications work, and the [employer] was sent into the [facility].

## 2019-08-20 ENCOUNTER — Ambulatory Visit
Admission: RE | Admit: 2019-08-20 | Discharge: 2019-08-20 | Disposition: A | Discharge status: Home / Self Care | Payer: Medicaid Other | Source: Ambulatory Visit | Attending: Radiation Oncology | Admitting: Radiation Oncology

## 2019-08-20 DIAGNOSIS — Z510 Encounter for antineoplastic radiation therapy: Secondary | ICD-10-CM | POA: Diagnosis not present

## 2019-08-21 ENCOUNTER — Ambulatory Visit
Admission: RE | Admit: 2019-08-21 | Discharge: 2019-08-21 | Disposition: A | Discharge status: Home / Self Care | Payer: Medicaid Other | Source: Ambulatory Visit | Attending: Radiation Oncology | Admitting: Radiation Oncology

## 2019-08-21 ENCOUNTER — Other Ambulatory Visit: Payer: Self-pay

## 2019-08-21 DIAGNOSIS — Z510 Encounter for antineoplastic radiation therapy: Secondary | ICD-10-CM | POA: Diagnosis not present

## 2019-08-24 ENCOUNTER — Other Ambulatory Visit: Payer: Self-pay

## 2019-08-24 ENCOUNTER — Ambulatory Visit
Admission: RE | Admit: 2019-08-24 | Discharge: 2019-08-24 | Disposition: A | Discharge status: Home / Self Care | Payer: Medicaid Other | Source: Ambulatory Visit | Attending: Radiation Oncology | Admitting: Radiation Oncology

## 2019-08-24 DIAGNOSIS — Z510 Encounter for antineoplastic radiation therapy: Secondary | ICD-10-CM | POA: Diagnosis not present

## 2019-08-25 ENCOUNTER — Ambulatory Visit
Admission: RE | Admit: 2019-08-25 | Discharge: 2019-08-25 | Disposition: A | Discharge status: Home / Self Care | Payer: Medicaid Other | Source: Ambulatory Visit | Attending: Radiation Oncology | Admitting: Radiation Oncology

## 2019-08-25 ENCOUNTER — Other Ambulatory Visit: Payer: Self-pay

## 2019-08-25 DIAGNOSIS — Z510 Encounter for antineoplastic radiation therapy: Secondary | ICD-10-CM | POA: Diagnosis not present

## 2019-08-26 ENCOUNTER — Other Ambulatory Visit: Payer: Self-pay

## 2019-08-26 ENCOUNTER — Encounter: Payer: Self-pay | Admitting: Hematology

## 2019-08-26 ENCOUNTER — Ambulatory Visit
Admission: RE | Admit: 2019-08-26 | Discharge: 2019-08-26 | Disposition: A | Discharge status: Home / Self Care | Payer: Medicaid Other | Source: Ambulatory Visit | Attending: Radiation Oncology | Admitting: Radiation Oncology

## 2019-08-26 ENCOUNTER — Telehealth: Payer: Self-pay | Admitting: Hematology

## 2019-08-26 ENCOUNTER — Inpatient Hospital Stay: Payer: Medicaid Other

## 2019-08-26 ENCOUNTER — Inpatient Hospital Stay (HOSPITAL_BASED_OUTPATIENT_CLINIC_OR_DEPARTMENT_OTHER): Payer: Medicaid Other | Admitting: Hematology

## 2019-08-26 VITALS — BP 102/68 | HR 83 | Temp 98.9°F | Resp 18 | Ht 63.5 in | Wt 98.5 lb

## 2019-08-26 DIAGNOSIS — C19 Malignant neoplasm of rectosigmoid junction: Secondary | ICD-10-CM | POA: Diagnosis not present

## 2019-08-26 DIAGNOSIS — R110 Nausea: Secondary | ICD-10-CM | POA: Diagnosis not present

## 2019-08-26 DIAGNOSIS — K6289 Other specified diseases of anus and rectum: Secondary | ICD-10-CM

## 2019-08-26 DIAGNOSIS — C20 Malignant neoplasm of rectum: Secondary | ICD-10-CM

## 2019-08-26 DIAGNOSIS — T451X5A Adverse effect of antineoplastic and immunosuppressive drugs, initial encounter: Secondary | ICD-10-CM

## 2019-08-26 DIAGNOSIS — Z510 Encounter for antineoplastic radiation therapy: Secondary | ICD-10-CM | POA: Diagnosis not present

## 2019-08-26 LAB — CBC WITH DIFFERENTIAL (CANCER CENTER ONLY)
Abs Immature Granulocytes: 0.01 10*3/uL (ref 0.00–0.07)
Basophils Absolute: 0 10*3/uL (ref 0.0–0.1)
Basophils Relative: 0 %
Eosinophils Absolute: 0.5 10*3/uL (ref 0.0–0.5)
Eosinophils Relative: 9 %
HCT: 39.4 % (ref 36.0–46.0)
Hemoglobin: 13.2 g/dL (ref 12.0–15.0)
Immature Granulocytes: 0 %
Lymphocytes Relative: 17 %
Lymphs Abs: 0.9 10*3/uL (ref 0.7–4.0)
MCH: 31.3 pg (ref 26.0–34.0)
MCHC: 33.5 g/dL (ref 30.0–36.0)
MCV: 93.4 fL (ref 80.0–100.0)
Monocytes Absolute: 0.4 10*3/uL (ref 0.1–1.0)
Monocytes Relative: 9 %
Neutro Abs: 3.2 10*3/uL (ref 1.7–7.7)
Neutrophils Relative %: 65 %
Platelet Count: 187 10*3/uL (ref 150–400)
RBC: 4.22 MIL/uL (ref 3.87–5.11)
RDW: 13.9 % (ref 11.5–15.5)
WBC Count: 5 10*3/uL (ref 4.0–10.5)
nRBC: 0 % (ref 0.0–0.2)

## 2019-08-26 LAB — CMP (CANCER CENTER ONLY)
ALT: 8 U/L (ref 0–44)
AST: 18 U/L (ref 15–41)
Albumin: 4 g/dL (ref 3.5–5.0)
Alkaline Phosphatase: 42 U/L (ref 38–126)
Anion gap: 9 (ref 5–15)
BUN: 12 mg/dL (ref 6–20)
CO2: 29 mmol/L (ref 22–32)
Calcium: 9.4 mg/dL (ref 8.9–10.3)
Chloride: 102 mmol/L (ref 98–111)
Creatinine: 0.73 mg/dL (ref 0.44–1.00)
GFR, Est AFR Am: >60 mL/min (ref >60)
GFR, Estimated: >60 mL/min (ref >60)
Glucose, Bld: 107 mg/dL — ABNORMAL HIGH (ref 70–99)
Potassium: 3.8 mmol/L (ref 3.5–5.1)
Sodium: 140 mmol/L (ref 135–145)
Total Bilirubin: 0.3 mg/dL (ref 0.3–1.2)
Total Protein: 6.9 g/dL (ref 6.5–8.1)

## 2019-08-26 LAB — MAGNESIUM: Magnesium: 2.1 mg/dL (ref 1.7–2.4)

## 2019-08-26 MED ORDER — HYDROCODONE-ACETAMINOPHEN 5-325 MG PO TABS: 1.0000 | ORAL_TABLET | Freq: Four times a day (QID) | ORAL | 0 refills | Status: DC | PRN

## 2019-08-26 NOTE — Progress Notes (Signed)
[place] OFFICE PROGRESS NOTE  Patient Care Team: Karleen Hampshire., MD as PCP - General (Internal Medicine) Cordelia Poche, RN as Oncology Nurse Navigator Tish Men, MD as Medical Oncologist (Oncology)  HEME/ONC OVERVIEW: 1. 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 Marcello Moores, who plans to move forward with surgery approximately 1 month after completing chemoRT -No imaging studies indicated prior to surgery unless the patient develops any suspicious symptoms  -PRN anti-emetics: Zofran, Compazine, Ativan  Cancer-related pain -Secondary to chemoradiation  -Localized to the rectum, exacerbated by bowel movement -I counseled the patient on the importance of maintaining soft bowel movement, including Miralax and stool softeners -We also discussed some additional supportive measures, including sitz bath -I have also prescribed Norco 5/325 q6hrs PRN for pain, and counseled the patient about the risk of constipation  Chemotherapy-associated nausea  -Secondary to chemotherapy -Symptoms relatively well controlled  -Continue PRN-anti-emetics  No orders of the defined types were placed in this  encounter.  All questions were answered. The patient knows to call the clinic with any problems, questions or concerns. No barriers to learning was detected.   A total of more than 25 minutes were spent face-to-face with the patient during this encounter and over half of that time was spent on counseling and coordination of care as outlined above.   Return in 2 weeks for labs and clinic follow-up.  Tish Men, MD 08/26/2019 3:41 PM  CHIEF COMPLAINT: "My bottom is sore"  INTERVAL HISTORY: Sarah [name] returns to clinic for follow-up of Stage II rectal cancer on neoadjuvant chemoradiation.  The patient reports her last week, she has some hemorrhoidal prolapse, which has improved.  She still has mild to moderate persistent discomfort in the rectal area, but she has not taken any medication for pain.  She also has had intermittent nausea, and anti-emetics are helping.  Her bowel movement is soft.  She denies any other complaint today.   REVIEW OF SYSTEMS:   Constitutional: ( - ) fevers, ( - )  chills , ( - ) night sweats Eyes: ( - ) blurriness of vision, ( - ) double vision, ( - ) watery eyes Ears, nose, mouth, throat, and face: ( - ) mucositis, ( - ) sore throat Respiratory: ( - ) cough, ( - ) dyspnea, ( - ) wheezes Cardiovascular: ( - ) palpitation, ( - ) chest discomfort, ( - ) lower extremity swelling Gastrointestinal:  ( + ) nausea, ( - ) heartburn, ( + ) change in bowel habits Skin: ( - ) abnormal skin rashes Lymphatics: ( - ) new lymphadenopathy, ( - ) easy bruising Neurological: ( - ) numbness, ( - ) tingling, ( - ) new weaknesses Behavioral/Psych: ( - ) mood change, ( - ) new changes  All  other systems were reviewed with the patient and are negative.  SUMMARY OF ONCOLOGIC HISTORY: Oncology History  Rectal adenocarcinoma ([place])  06/25/2019 Initial Diagnosis   Colon cancer ([place])   06/25/2019 Procedure   Colonoscopy: - Hemorrhoids found on perianal exam. - One 9 mm polyp in the sigmoid  colon, removed with a cold snare. Resected and retrieved. - Malignant partially obstructing tumor in the recto-sigmoid colon. Biopsied. Tattooed. - Non-bleeding internal hemorrhoids. - The descending colon and distal transverse colon are normal.   06/30/2019 Imaging   CT chest, abdomen and pelvis w/ contrast:  IMPRESSION: 1. Extensive mural thickening in the proximal rectum compatible with the recently diagnosed rectal mass. There are several prominent but nonenlarged mesorectal lymph nodes associated with this which are nonspecific, but concerning for potential nodal disease. No other sites of distal metastatic disease confidently identified elsewhere in the chest, abdomen or pelvis. 2. A few scattered 1-3 mm peripheral predominant pulmonary nodules. These are nonspecific, but strongly favored to be benign. Attention at time of routine follow-up is recommended to ensure stability. 3. Old granulomatous disease, as above.   07/17/2019 Imaging   MRI pelvis w/ contrast:  IMPRESSION: Rectal adenocarcinoma T stage: 2   Rectal adenocarcinoma N stage:  0   Distance from tumor to the internal anal sphincter is 8.8 cm.   07/22/2019 Cancer Staging   Staging form: Colon and Rectum - Neuroendocine Tumors, AJCC 8th Edition - Clinical: Stage IIA (cT2, cN0, cM0) - Signed by Tish Men, MD on 07/22/2019   08/04/2019 -  Chemotherapy   The patient had [No matching medication found in this treatment plan]  for chemotherapy treatment.      I have reviewed the past medical history, past surgical history, social history and family history with the patient and they are unchanged from previous note.  ALLERGIES:  has No Known Allergies.  MEDICATIONS:  Current Outpatient Medications  Medication Sig Dispense Refill  . hydrocortisone (ANUSOL-HC) 25 MG suppository Place 1 suppository (25 mg total) rectally 2 (two) times daily. 12 suppository 3  . loperamide (IMODIUM) 2 MG capsule Take 2 at diarrhea onset,  then 1 every 2hr until 12hrs with no BM. May take 2 every 4hrs at night. If diarrhea recurs repeat. 100 capsule 1  . LORazepam (ATIVAN) 0.5 MG tablet Take 1 tablet (0.5 mg total) by mouth every 6 (six) hours as needed (Nausea or vomiting). 30 tablet 0  . omeprazole (PRILOSEC) 20 MG capsule Take 1 capsule (20 mg total) by mouth 2 (two) times daily before a meal. (Patient taking differently: Take 20 mg by mouth daily. ) 180 capsule 1  . ondansetron (ZOFRAN) 8 MG tablet Take 1 tablet (8 mg total) by mouth 2 (two) times daily as needed (Nausea or vomiting). 30 tablet 1  . prochlorperazine (COMPAZINE) 10 MG tablet Take 1 tablet (10 mg total) by mouth every 6 (six) hours as needed (Nausea or vomiting). 30 tablet 1   No current facility-administered medications for this visit.     PHYSICAL EXAMINATION: ECOG PERFORMANCE STATUS: 1 - Symptomatic but completely ambulatory  Today's Vitals   08/26/19 1530 08/26/19 1531  BP: 102/68   Pulse: 83   Resp: 18   Temp: 98.9 F (37.2 C)   TempSrc: Temporal   SpO2: 100%   Weight: 98 lb 8 oz (44.7 kg)   Height: 5' 3.5" (1.613 m)   PainSc:  5    Body mass index is 17.17 kg/m.  Filed Weights   08/26/19 1530  Weight: 98 lb 8 oz (44.7 kg)    GENERAL: alert, no distress and comfortable SKIN: skin color, texture, turgor are normal, no rashes or significant lesions EYES: conjunctiva are pink and non-injected, sclera clear OROPHARYNX: no exudate, no erythema; lips, buccal mucosa, and tongue normal  NECK: supple, non-tender LUNGS: clear to auscultation with normal breathing effort HEART: regular rate & rhythm and no murmurs and no lower extremity edema ABDOMEN: soft, non-tender, non-distended, normal bowel sounds Musculoskeletal: no cyanosis of digits and no clubbing  PSYCH: alert & oriented x 3, fluent speech NEURO: no focal motor/sensory deficits  LABORATORY DATA:  I have reviewed the data as listed    Component Value Date/Time   NA 136 08/12/2019  0948   K 4.3 08/12/2019 0948   CL 102 08/12/2019 0948   CO2 28 08/12/2019 0948   GLUCOSE 101 (H) 08/12/2019 0948   BUN 17 08/12/2019 0948   CREATININE 0.76 08/12/2019 0948   CALCIUM 8.9 08/12/2019 0948   PROT 6.9 08/12/2019 0948   ALBUMIN 4.0 08/12/2019 0948   AST 21 08/12/2019 0948   ALT 8 08/12/2019 0948   ALKPHOS 44 08/12/2019 0948   BILITOT 0.4 08/12/2019 0948   GFRNONAA >60 08/12/2019 0948   GFRAA >60 08/12/2019 0948    No results found for: SPEP, UPEP  Lab Results  Component Value Date   WBC 5.0 08/26/2019   NEUTROABS 3.2 08/26/2019   HGB 13.2 08/26/2019   HCT 39.4 08/26/2019   MCV 93.4 08/26/2019   PLT 187 08/26/2019      Chemistry      Component Value Date/Time   NA 136 08/12/2019 0948   K 4.3 08/12/2019 0948   CL 102 08/12/2019 0948   CO2 28 08/12/2019 0948   BUN 17 08/12/2019 0948   CREATININE 0.76 08/12/2019 0948      Component Value Date/Time   CALCIUM 8.9 08/12/2019 0948   ALKPHOS 44 08/12/2019 0948   AST 21 08/12/2019 0948   ALT 8 08/12/2019 0948   BILITOT 0.4 08/12/2019 0948       RADIOGRAPHIC STUDIES: I have personally reviewed the radiological images as listed below and agreed with the findings in the report. No results found.

## 2019-08-26 NOTE — Telephone Encounter (Signed)
Scheduled per 09/30 los, patient received after visit summary and calender. °

## 2019-08-27 ENCOUNTER — Ambulatory Visit
Admission: RE | Admit: 2019-08-27 | Discharge: 2019-08-27 | Disposition: A | Discharge status: Home / Self Care | Payer: Medicaid Other | Source: Ambulatory Visit | Attending: Radiation Oncology | Admitting: Radiation Oncology

## 2019-08-27 ENCOUNTER — Other Ambulatory Visit: Payer: Self-pay

## 2019-08-27 DIAGNOSIS — Z510 Encounter for antineoplastic radiation therapy: Secondary | ICD-10-CM | POA: Insufficient documentation

## 2019-08-27 DIAGNOSIS — C20 Malignant neoplasm of rectum: Secondary | ICD-10-CM | POA: Insufficient documentation

## 2019-08-27 MED FILL — CAPECITABINE 500 MG TABS: 500 | 10 days supply | Qty: 32 | Fill #1

## 2019-08-28 ENCOUNTER — Ambulatory Visit
Admission: RE | Admit: 2019-08-28 | Discharge: 2019-08-28 | Disposition: A | Discharge status: Home / Self Care | Payer: Medicaid Other | Source: Ambulatory Visit | Attending: Radiation Oncology | Admitting: Radiation Oncology

## 2019-08-28 DIAGNOSIS — Z510 Encounter for antineoplastic radiation therapy: Secondary | ICD-10-CM | POA: Diagnosis not present

## 2019-08-31 ENCOUNTER — Ambulatory Visit
Admission: RE | Admit: 2019-08-31 | Discharge: 2019-08-31 | Disposition: A | Discharge status: Home / Self Care | Payer: Medicaid Other | Source: Ambulatory Visit | Attending: Radiation Oncology | Admitting: Radiation Oncology

## 2019-08-31 ENCOUNTER — Other Ambulatory Visit: Payer: Self-pay

## 2019-08-31 DIAGNOSIS — Z510 Encounter for antineoplastic radiation therapy: Secondary | ICD-10-CM | POA: Diagnosis not present

## 2019-09-01 ENCOUNTER — Ambulatory Visit
Admission: RE | Admit: 2019-09-01 | Discharge: 2019-09-01 | Disposition: A | Discharge status: Home / Self Care | Payer: Medicaid Other | Source: Ambulatory Visit | Attending: Radiation Oncology | Admitting: Radiation Oncology

## 2019-09-01 DIAGNOSIS — Z510 Encounter for antineoplastic radiation therapy: Secondary | ICD-10-CM | POA: Diagnosis not present

## 2019-09-02 ENCOUNTER — Other Ambulatory Visit: Payer: Self-pay

## 2019-09-02 ENCOUNTER — Ambulatory Visit
Admission: RE | Admit: 2019-09-02 | Discharge: 2019-09-02 | Disposition: A | Discharge status: Home / Self Care | Payer: Medicaid Other | Source: Ambulatory Visit | Attending: Radiation Oncology | Admitting: Radiation Oncology

## 2019-09-02 DIAGNOSIS — Z510 Encounter for antineoplastic radiation therapy: Secondary | ICD-10-CM | POA: Diagnosis not present

## 2019-09-03 ENCOUNTER — Other Ambulatory Visit: Payer: Self-pay

## 2019-09-03 ENCOUNTER — Ambulatory Visit
Admission: RE | Admit: 2019-09-03 | Discharge: 2019-09-03 | Disposition: A | Discharge status: Home / Self Care | Payer: Medicaid Other | Source: Ambulatory Visit | Attending: Radiation Oncology | Admitting: Radiation Oncology

## 2019-09-03 DIAGNOSIS — Z510 Encounter for antineoplastic radiation therapy: Secondary | ICD-10-CM | POA: Diagnosis not present

## 2019-09-04 ENCOUNTER — Ambulatory Visit
Admission: RE | Admit: 2019-09-04 | Discharge: 2019-09-04 | Disposition: A | Discharge status: Home / Self Care | Payer: Medicaid Other | Source: Ambulatory Visit | Attending: Radiation Oncology | Admitting: Radiation Oncology

## 2019-09-04 DIAGNOSIS — Z510 Encounter for antineoplastic radiation therapy: Secondary | ICD-10-CM | POA: Diagnosis not present

## 2019-09-07 ENCOUNTER — Other Ambulatory Visit: Payer: Self-pay

## 2019-09-07 ENCOUNTER — Ambulatory Visit
Admission: RE | Admit: 2019-09-07 | Discharge: 2019-09-07 | Disposition: A | Discharge status: Home / Self Care | Payer: Medicaid Other | Source: Ambulatory Visit | Attending: Radiation Oncology | Admitting: Radiation Oncology

## 2019-09-07 DIAGNOSIS — Z510 Encounter for antineoplastic radiation therapy: Secondary | ICD-10-CM | POA: Diagnosis not present

## 2019-09-08 ENCOUNTER — Other Ambulatory Visit: Payer: Self-pay

## 2019-09-08 ENCOUNTER — Ambulatory Visit
Admission: RE | Admit: 2019-09-08 | Discharge: 2019-09-08 | Disposition: A | Discharge status: Home / Self Care | Payer: Medicaid Other | Source: Ambulatory Visit | Attending: Radiation Oncology | Admitting: Radiation Oncology

## 2019-09-08 DIAGNOSIS — Z510 Encounter for antineoplastic radiation therapy: Secondary | ICD-10-CM | POA: Diagnosis not present

## 2019-09-09 ENCOUNTER — Inpatient Hospital Stay (HOSPITAL_BASED_OUTPATIENT_CLINIC_OR_DEPARTMENT_OTHER): Payer: Medicaid Other | Admitting: Hematology

## 2019-09-09 ENCOUNTER — Encounter: Payer: Self-pay | Admitting: Hematology

## 2019-09-09 ENCOUNTER — Ambulatory Visit
Admission: RE | Admit: 2019-09-09 | Discharge: 2019-09-09 | Disposition: A | Discharge status: Home / Self Care | Payer: Medicaid Other | Source: Ambulatory Visit | Attending: Radiation Oncology | Admitting: Radiation Oncology

## 2019-09-09 ENCOUNTER — Inpatient Hospital Stay: Payer: Medicaid Other | Attending: Hematology

## 2019-09-09 ENCOUNTER — Other Ambulatory Visit: Payer: Self-pay

## 2019-09-09 ENCOUNTER — Telehealth: Payer: Self-pay | Admitting: Hematology

## 2019-09-09 VITALS — BP 99/79 | HR 77 | Temp 98.7°F | Resp 16 | Ht 63.5 in | Wt 98.2 lb

## 2019-09-09 DIAGNOSIS — Z23 Encounter for immunization: Secondary | ICD-10-CM

## 2019-09-09 DIAGNOSIS — C20 Malignant neoplasm of rectum: Secondary | ICD-10-CM | POA: Diagnosis not present

## 2019-09-09 DIAGNOSIS — C19 Malignant neoplasm of rectosigmoid junction: Secondary | ICD-10-CM | POA: Insufficient documentation

## 2019-09-09 DIAGNOSIS — Z79899 Other long term (current) drug therapy: Secondary | ICD-10-CM | POA: Diagnosis not present

## 2019-09-09 DIAGNOSIS — G893 Neoplasm related pain (acute) (chronic): Secondary | ICD-10-CM | POA: Insufficient documentation

## 2019-09-09 DIAGNOSIS — T451X5A Adverse effect of antineoplastic and immunosuppressive drugs, initial encounter: Secondary | ICD-10-CM

## 2019-09-09 DIAGNOSIS — R918 Other nonspecific abnormal finding of lung field: Secondary | ICD-10-CM | POA: Diagnosis not present

## 2019-09-09 DIAGNOSIS — K521 Toxic gastroenteritis and colitis: Secondary | ICD-10-CM

## 2019-09-09 DIAGNOSIS — R197 Diarrhea, unspecified: Secondary | ICD-10-CM | POA: Insufficient documentation

## 2019-09-09 DIAGNOSIS — Z510 Encounter for antineoplastic radiation therapy: Secondary | ICD-10-CM | POA: Diagnosis not present

## 2019-09-09 DIAGNOSIS — Z9221 Personal history of antineoplastic chemotherapy: Secondary | ICD-10-CM | POA: Insufficient documentation

## 2019-09-09 LAB — CBC WITH DIFFERENTIAL (CANCER CENTER ONLY)
Abs Immature Granulocytes: 0.01 10*3/uL (ref 0.00–0.07)
Basophils Absolute: 0 10*3/uL (ref 0.0–0.1)
Basophils Relative: 0 %
Eosinophils Absolute: 0.4 10*3/uL (ref 0.0–0.5)
Eosinophils Relative: 9 %
HCT: 37.1 % (ref 36.0–46.0)
Hemoglobin: 12.6 g/dL (ref 12.0–15.0)
Immature Granulocytes: 0 %
Lymphocytes Relative: 15 %
Lymphs Abs: 0.7 10*3/uL (ref 0.7–4.0)
MCH: 32.4 pg (ref 26.0–34.0)
MCHC: 34 g/dL (ref 30.0–36.0)
MCV: 95.4 fL (ref 80.0–100.0)
Monocytes Absolute: 0.6 10*3/uL (ref 0.1–1.0)
Monocytes Relative: 12 %
Neutro Abs: 3 10*3/uL (ref 1.7–7.7)
Neutrophils Relative %: 64 %
Platelet Count: 234 10*3/uL (ref 150–400)
RBC: 3.89 MIL/uL (ref 3.87–5.11)
RDW: 16.3 % — ABNORMAL HIGH (ref 11.5–15.5)
WBC Count: 4.8 10*3/uL (ref 4.0–10.5)
nRBC: 0 % (ref 0.0–0.2)

## 2019-09-09 LAB — CMP (CANCER CENTER ONLY)
ALT: 10 U/L (ref 0–44)
AST: 22 U/L (ref 15–41)
Albumin: 3.8 g/dL (ref 3.5–5.0)
Alkaline Phosphatase: 43 U/L (ref 38–126)
Anion gap: 10 (ref 5–15)
BUN: 13 mg/dL (ref 6–20)
CO2: 28 mmol/L (ref 22–32)
Calcium: 8.9 mg/dL (ref 8.9–10.3)
Chloride: 99 mmol/L (ref 98–111)
Creatinine: 0.75 mg/dL (ref 0.44–1.00)
GFR, Est AFR Am: >60 mL/min (ref >60)
GFR, Estimated: >60 mL/min (ref >60)
Glucose, Bld: 89 mg/dL (ref 70–99)
Potassium: 3.9 mmol/L (ref 3.5–5.1)
Sodium: 137 mmol/L (ref 135–145)
Total Bilirubin: 0.4 mg/dL (ref 0.3–1.2)
Total Protein: 6.9 g/dL (ref 6.5–8.1)

## 2019-09-09 LAB — MAGNESIUM: Magnesium: 2.1 mg/dL (ref 1.7–2.4)

## 2019-09-09 MED ORDER — INFLUENZA VAC SPLIT QUAD 0.5 ML IM SUSY
0.5000 mL | PREFILLED_SYRINGE | Freq: Once | INTRAMUSCULAR | Status: AC
  Administered 2019-09-09: 15:00:00 0.5 mL via INTRAMUSCULAR

## 2019-09-09 MED ORDER — INFLUENZA VAC SPLIT QUAD 0.5 ML IM SUSY
PREFILLED_SYRINGE | INTRAMUSCULAR | Status: AC
  Filled 2019-09-09: qty 0.5

## 2019-09-09 NOTE — Progress Notes (Signed)
[place] OFFICE PROGRESS NOTE  Patient Care Team: Karleen Hampshire., MD as PCP - General (Internal Medicine) Cordelia Poche, RN as Oncology Nurse Navigator Tish Men, MD as Medical Oncologist (Oncology)  HEME/ONC OVERVIEW: 1. 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 Marcello Moores, who plans to move forward with surgery approximately 1 month after completing chemoRT; I have reached out to Dr. Marcello Moores to update her on the treatment progress  -No imaging studies indicated prior to surgery unless the patient develops any suspicious symptoms  -PRN anti-emetics: Zofran, Compazine, Ativan  Cancer-related pain -Secondary to chemoradiation  -Localized to the rectum, exacerbated by bowel movement -Pain relatively adequately controlled with PRN Norco, warm bath and Aquaphor  -I also encouraged the patient to take sitz bath 2-3x/day, which may help with the pain   Chemotherapy-associated diarrhea -Secondary to chemotherapy as well as radiation  -Exacerbated by solid food; no abdominal pain, hematochezia or melena  -I recommended the patient to take Imodium as needed for diarrhea, and maintain adequate hydration  No  orders of the defined types were placed in this encounter.  All questions were answered. The patient knows to call the clinic with any problems, questions or concerns. No barriers to learning was detected.  Return in 2 weeks for labs and clinic follow-up.   Tish Men, MD 09/09/2019 2:34 PM  CHIEF COMPLAINT: "My bottom is sore"  INTERVAL HISTORY: [facility] for follow-up of the rectal cancer on neoadjuvant chemoradiation.  Patient reports that over the past 2 weeks, she has had intermittent rectal pain, exacerbated after eating solid food, for which she has been applying Aquaphor lotion, taking warm baths, and periodic Norco with some pain relief.  She also reports intermittent abdominal cramping, but denies any abdominal pain, nausea, vomiting.  Due to her rectal pain being exacerbated by solid food, she has been alternating her diet between solids and liquids.  Her weight has remained overall stable.  She denies any other complaint today.  REVIEW OF SYSTEMS:   Constitutional: ( - ) fevers, ( - )  chills , ( - ) night sweats Eyes: ( - ) blurriness of vision, ( - ) double vision, ( - ) watery eyes Ears, nose, mouth, throat, and face: ( - ) mucositis, ( - ) sore throat Respiratory: ( - ) cough, ( - ) dyspnea, ( - ) wheezes Cardiovascular: ( - ) palpitation, ( - ) chest discomfort, ( - ) lower extremity swelling Gastrointestinal:  ( - ) nausea, ( - ) heartburn, ( + ) change in bowel habits Skin: ( - ) abnormal skin rashes Lymphatics: ( - ) new lymphadenopathy, ( - ) easy bruising Neurological: ( - ) numbness, ( - ) tingling, ( - ) new weaknesses Behavioral/Psych: ( - ) mood  change, ( - ) new changes  All other systems were reviewed with the patient and are negative.  SUMMARY OF ONCOLOGIC HISTORY: Oncology History  Rectal adenocarcinoma ([place])  06/25/2019 Initial Diagnosis   Colon cancer ([place])   06/25/2019 Procedure   Colonoscopy: - Hemorrhoids found on perianal exam. - One 9  mm polyp in the sigmoid colon, removed with a cold snare. Resected and retrieved. - Malignant partially obstructing tumor in the recto-sigmoid colon. Biopsied. Tattooed. - Non-bleeding internal hemorrhoids. - The descending colon and distal transverse colon are normal.   06/30/2019 Imaging   CT chest, abdomen and pelvis w/ contrast:  IMPRESSION: 1. Extensive mural thickening in the proximal rectum compatible with the recently diagnosed rectal mass. There are several prominent but nonenlarged mesorectal lymph nodes associated with this which are nonspecific, but concerning for potential nodal disease. No other sites of distal metastatic disease confidently identified elsewhere in the chest, abdomen or pelvis. 2. A few scattered 1-3 mm peripheral predominant pulmonary nodules. These are nonspecific, but strongly favored to be benign. Attention at time of routine follow-up is recommended to ensure stability. 3. Old granulomatous disease, as above.   07/17/2019 Imaging   MRI pelvis w/ contrast:  IMPRESSION: Rectal adenocarcinoma T stage: 2   Rectal adenocarcinoma N stage:  0   Distance from tumor to the internal anal sphincter is 8.8 cm.   07/22/2019 Cancer Staging   Staging form: Colon and Rectum - Neuroendocine Tumors, AJCC 8th Edition - Clinical: Stage IIA (cT2, cN0, cM0) - Signed by Tish Men, MD on 07/22/2019   08/04/2019 -  Chemotherapy   The patient had [No matching medication found in this treatment plan]  for chemotherapy treatment.      I have reviewed the past medical history, past surgical history, social history and family history with the patient and they are unchanged from previous note.  ALLERGIES:  has No Known Allergies.  MEDICATIONS:  Current Outpatient Medications  Medication Sig Dispense Refill  . HYDROcodone-acetaminophen (NORCO) 5-325 MG tablet Take 1 tablet by mouth every 6 (six) hours as needed for moderate pain. 30 tablet 0  . hydrocortisone (ANUSOL-HC)  25 MG suppository Place 1 suppository (25 mg total) rectally 2 (two) times daily. 12 suppository 3  . loperamide (IMODIUM) 2 MG capsule Take 2 at diarrhea onset, then 1 every 2hr until 12hrs with no BM. May take 2 every 4hrs at night. If diarrhea recurs repeat. 100 capsule 1  . LORazepam (ATIVAN) 0.5 MG tablet Take 1 tablet (0.5 mg total) by mouth every 6 (six) hours as needed (Nausea or vomiting). 30 tablet 0  . omeprazole (PRILOSEC) 20 MG capsule Take 1 capsule (20 mg total) by mouth 2 (two) times daily before a meal. (Patient taking differently: Take 20 mg by mouth daily. ) 180 capsule 1  . ondansetron (ZOFRAN) 8 MG tablet Take 1 tablet (8 mg total) by mouth 2 (two) times daily as needed (Nausea or vomiting). 30 tablet 1  . prochlorperazine (COMPAZINE) 10 MG tablet Take 1 tablet (10 mg total) by mouth every 6 (six) hours as needed (Nausea or vomiting). 30 tablet 1   Current Facility-Administered Medications  Medication Dose Route Frequency Provider Last Rate Last Dose  . influenza vac split quadrivalent PF (FLUARIX) injection 0.5 mL  0.5 mL Intramuscular Once Tish Men, MD        PHYSICAL EXAMINATION: ECOG PERFORMANCE STATUS: 1 - Symptomatic but completely ambulatory  Today's Vitals   09/09/19 1421 09/09/19 1428  BP: 99/79  Pulse: 77   Resp: 16   Temp: 98.7 F (37.1 C)   TempSrc: Temporal   SpO2: 100%   Weight: 98 lb 3.2 oz (44.5 kg)   Height: 5' 3.5" (1.613 m)   PainSc:  0-No pain   Body mass index is 17.12 kg/m.  Filed Weights   09/09/19 1421  Weight: 98 lb 3.2 oz (44.5 kg)    GENERAL: alert, no distress and comfortable, thin SKIN: skin color, texture, turgor are normal, no rashes or significant lesions EYES: conjunctiva are pink and non-injected, sclera clear OROPHARYNX: no exudate, no erythema; lips, buccal mucosa, and tongue normal  NECK: supple, non-tender LUNGS: clear to auscultation with normal breathing effort HEART: regular rate & rhythm and no murmurs and no  lower extremity edema ABDOMEN: soft, non-tender, non-distended, normal bowel sounds Musculoskeletal: no cyanosis of digits and no clubbing  PSYCH: alert & oriented x 3, fluent speech NEURO: no focal motor/sensory deficits  LABORATORY DATA:  I have reviewed the data as listed    Component Value Date/Time   NA 140 08/26/2019 1516   K 3.8 08/26/2019 1516   CL 102 08/26/2019 1516   CO2 29 08/26/2019 1516   GLUCOSE 107 (H) 08/26/2019 1516   BUN 12 08/26/2019 1516   CREATININE 0.73 08/26/2019 1516   CALCIUM 9.4 08/26/2019 1516   PROT 6.9 08/26/2019 1516   ALBUMIN 4.0 08/26/2019 1516   AST 18 08/26/2019 1516   ALT 8 08/26/2019 1516   ALKPHOS 42 08/26/2019 1516   BILITOT 0.3 08/26/2019 1516   GFRNONAA >60 08/26/2019 1516   GFRAA >60 08/26/2019 1516    No results found for: SPEP, UPEP  Lab Results  Component Value Date   WBC 4.8 09/09/2019   NEUTROABS 3.0 09/09/2019   HGB 12.6 09/09/2019   HCT 37.1 09/09/2019   MCV 95.4 09/09/2019   PLT 234 09/09/2019      Chemistry      Component Value Date/Time   NA 140 08/26/2019 1516   K 3.8 08/26/2019 1516   CL 102 08/26/2019 1516   CO2 29 08/26/2019 1516   BUN 12 08/26/2019 1516   CREATININE 0.73 08/26/2019 1516      Component Value Date/Time   CALCIUM 9.4 08/26/2019 1516   ALKPHOS 42 08/26/2019 1516   AST 18 08/26/2019 1516   ALT 8 08/26/2019 1516   BILITOT 0.3 08/26/2019 1516       RADIOGRAPHIC STUDIES: I have personally reviewed the radiological images as listed below and agreed with the findings in the report. No results found.

## 2019-09-09 NOTE — Telephone Encounter (Signed)
Scheduled appt per 10/14 los - -pt aware of appt date and time  

## 2019-09-10 ENCOUNTER — Other Ambulatory Visit: Payer: Self-pay

## 2019-09-10 ENCOUNTER — Ambulatory Visit
Admission: RE | Admit: 2019-09-10 | Discharge: 2019-09-10 | Disposition: A | Discharge status: Home / Self Care | Payer: Medicaid Other | Source: Ambulatory Visit | Attending: Radiation Oncology | Admitting: Radiation Oncology

## 2019-09-10 ENCOUNTER — Encounter: Payer: Self-pay | Admitting: Radiation Oncology

## 2019-09-10 DIAGNOSIS — Z510 Encounter for antineoplastic radiation therapy: Secondary | ICD-10-CM | POA: Diagnosis not present

## 2019-09-14 ENCOUNTER — Telehealth: Payer: Self-pay | Admitting: Gastroenterology

## 2019-09-14 NOTE — Telephone Encounter (Signed)
Called and spoke with patient-patient reports Sarah [name] called her and asked her to do lab work-but does not know what the lab work is for-please call and speak with patient concerning this matter as she has requested to speak with you

## 2019-09-15 NOTE — Telephone Encounter (Signed)
LMOM for patient to return my call. Patient needs to go to [facility] lab to re-check H. Pylori in the stool.

## 2019-09-15 NOTE — Telephone Encounter (Signed)
Pt returned your call.  

## 2019-09-16 ENCOUNTER — Telehealth: Payer: Self-pay | Admitting: *Deleted

## 2019-09-16 ENCOUNTER — Other Ambulatory Visit: Payer: Medicaid Other

## 2019-09-16 ENCOUNTER — Other Ambulatory Visit: Payer: Self-pay | Admitting: Hematology

## 2019-09-16 DIAGNOSIS — C20 Malignant neoplasm of rectum: Secondary | ICD-10-CM

## 2019-09-16 DIAGNOSIS — K6289 Other specified diseases of anus and rectum: Secondary | ICD-10-CM

## 2019-09-16 MED ORDER — HYDROCODONE-ACETAMINOPHEN 5-325 MG PO TABS: 1.0000 | ORAL_TABLET | Freq: Four times a day (QID) | ORAL | 0 refills | Status: DC | PRN

## 2019-09-16 NOTE — Telephone Encounter (Signed)
The prescription has been refilled.  Thanks.  Dr. Maylon Peppers

## 2019-09-16 NOTE — Telephone Encounter (Signed)
Received vm message from patient requesting a refill for her Hydrocodone/apap.  Last filled on 08/26/19 for # 30

## 2019-09-16 NOTE — Telephone Encounter (Signed)
Notified patient of order placed for H. Pylori stool, patient is going to the [facility] lab later this week to pick up stool kit.

## 2019-09-16 NOTE — Telephone Encounter (Signed)
Pt notified that her prescription has been refilled.

## 2019-09-18 ENCOUNTER — Other Ambulatory Visit: Payer: Medicaid Other

## 2019-09-18 DIAGNOSIS — A048 Other specified bacterial intestinal infections: Secondary | ICD-10-CM

## 2019-09-20 LAB — H. PYLORI ANTIGEN, STOOL: H pylori Ag, Stl: NEGATIVE

## 2019-09-21 ENCOUNTER — Ambulatory Visit: Payer: Self-pay | Admitting: General Surgery

## 2019-09-21 NOTE — H&P (Signed)
History of Present Illness Sarah Ruff MD; [id number] 11:44 AM) The patient is a 48 year old female who presents with colorectal cancer. 48 year old female who presents to the office after the diagnosis of rectosigmoid cancer. This was seen on colonoscopy which was performed due to changes in bowel habits and rectal bleeding. This showed a rectosigmoid mass. Biopsy showed adenocarcinoma. This was tattooed proximally and distally. CT scans of the chest, abdomen and pelvis show no signs of metastatic disease and CEA was normal. Patient is a long-term smoker but has since quit smoking after these findings.    Past Surgical History Sarah [name], [place]; 09/21/2019 10:59 AM) Colon Polyp Removal - Colonoscopy  Diagnostic Studies History Sarah [name], CMA; 09/21/2019 10:59 AM) Colonoscopy within last year Mammogram 1-3 years ago Pap Smear 1-5 years ago  Allergies Sarah [name], CMA; 09/21/2019 11:00 AM) Augmentin *PENICILLINS* Nausea. Allergies Reconciled  Medication History Sarah [name], CMA; 09/21/2019 11:00 AM) Doxycycline Hyclate (50MG  Capsule, Oral) Active. metroNIDAZOLE (250MG  Tablet, Oral) Active. PriLOSEC OTC (20MG  Tablet DR, Oral) Active. LORazepam (0.5MG  Tablet, Oral) Active. HYDROcodone-Acetaminophen (5-325MG  Tablet, Oral) Active. Prochlorperazine Maleate (10MG  Tablet, Oral) Active. Medications Reconciled  Social History Sarah [name], [place]; 09/21/2019 10:59 AM) Alcohol use Occasional alcohol use, Recently quit alcohol use. Caffeine use Carbonated beverages, Coffee. No drug use Tobacco use Former smoker.  Family History Sarah [name], [place]; 09/21/2019 10:59 AM) Arthritis Father, Mother. Colon Polyps Father, Mother. Diabetes Mellitus Father.  Pregnancy / Birth History Sarah [name], [place]; 09/21/2019 10:59 AM) Age at menarche 44 years. Age of menopause <43 Gravida 3 Length (months) of breastfeeding 7-12 Maternal age  50-30 Para 3  Other Problems Sarah [name], [place]; 09/21/2019 10:59 AM) Anxiety Disorder Colon Cancer Depression Gastroesophageal Reflux Disease Hemorrhoids     Review of Systems Sarah [name] CMA; 09/21/2019 10:59 AM) General Present- Fatigue. Not Present- Appetite Loss, Chills, Fever, Night Sweats, Weight Gain and Weight Loss. Skin Present- Rash. Not Present- Change in Wart/Mole, Dryness, Hives, Jaundice, New Lesions, Non-Healing Wounds and Ulcer. HEENT Not Present- Earache, Hearing Loss, Hoarseness, Nose Bleed, Oral Ulcers, Ringing in the Ears, Seasonal Allergies, Sinus Pain, Sore Throat, Visual Disturbances, Wears glasses/contact lenses and Yellow Eyes. Respiratory Not Present- Bloody sputum, Chronic Cough, Difficulty Breathing, Snoring and Wheezing. Breast Not Present- Breast Mass, Breast Pain, Nipple Discharge and Skin Changes. Cardiovascular Not Present- Chest Pain, Difficulty Breathing Lying Down, Leg Cramps, Palpitations, Rapid Heart Rate, Shortness of Breath and Swelling of Extremities. Gastrointestinal Present- Abdominal Pain, Change in Bowel Habits, Hemorrhoids and Nausea. Not Present- Bloating, Bloody Stool, Chronic diarrhea, Constipation, Difficulty Swallowing, Excessive gas, Gets full quickly at meals, Indigestion, Rectal Pain and Vomiting. Female Genitourinary Not Present- Frequency, Nocturia, Painful Urination, Pelvic Pain and Urgency. Musculoskeletal Not Present- Back Pain, Joint Pain, Joint Stiffness, Muscle Pain, Muscle Weakness and Swelling of Extremities. Neurological Not Present- Decreased Memory, Fainting, Headaches, Numbness, Seizures, Tingling, Tremor, Trouble walking and Weakness. Psychiatric Present- Anxiety and Depression. Not Present- Bipolar, Change in Sleep Pattern, Fearful and Frequent crying. Endocrine Not Present- Cold Intolerance, Excessive Hunger, Hair Changes, Heat Intolerance, Hot flashes and New Diabetes. Hematology Not Present- Blood  Thinners, Easy Bruising, Excessive bleeding, Gland problems, HIV and Persistent Infections.  Vitals Sarah [name] CMA; 09/21/2019 11:00 AM) 09/21/2019 10:59 AM Weight: 97.8 lb Height: 63in Body Surface Area: 1.43 m Body Mass Index: 17.32 kg/m  Temp.: 98.56F  Pulse: 92 (Regular)  BP: 116/78 (Sitting, Left Arm, Standard)        Physical Exam Sarah Ruff MD; [id number] 11:44 AM)  General Mental  Status-Alert. General Appearance-Not in acute distress. Build & Nutrition-Well nourished. Posture-Normal posture. Gait-Normal.  Head and Neck Head-normocephalic, atraumatic with no lesions or palpable masses. Trachea-midline.  Chest and Lung Exam Chest and lung exam reveals -on auscultation, normal breath sounds, no adventitious sounds and normal vocal resonance.  Cardiovascular Cardiovascular examination reveals -normal heart sounds, regular rate and rhythm with no murmurs and femoral artery auscultation bilaterally reveals normal pulses, no bruits, no thrills.  Abdomen Inspection Inspection of the abdomen reveals - No Hernias. Palpation/Percussion Palpation and Percussion of the abdomen reveal - Soft, Non Tender, No Rigidity (guarding), No hepatosplenomegaly and No Palpable abdominal masses.  Rectal Anorectal Exam Internal - Note: no mass palpated.  Neurologic Neurologic evaluation reveals -alert and oriented x 3 with no impairment of recent or remote memory, normal attention span and ability to concentrate, normal sensation and normal coordination.  Musculoskeletal Normal Exam - Bilateral-Upper Extremity Strength Normal and Lower Extremity Strength Normal.    Assessment & Plan Sarah Ruff MD; [id number] 11:23 AM)  RECTAL CANCER (C20) Impression: 48 year old female who presents to the office or follow-up for her rectal cancer. She did undergo tattooing of her rectal cancer, partially 3 cm distal to the edge of the tumor. She is  status post completion of chemotherapy and radiation on September 10, 2019. By imaging, she has a stage II rectal cancer approximately 8 cm from the anal sphincter complex. She has no sign of metastatic disease on CT scans. We have discussed surgery in detail. All questions were answered. We'll plan on doing a low anterior resection. She may need a diverting ileostomy. The surgery and anatomy were described to the patient as well as the risks of surgery and the possible complications. These include: Bleeding, deep abdominal infections and possible wound complications such as hernia and infection, damage to adjacent structures, leak of surgical connections, which can lead to other surgeries and possibly an ostomy, possible need for other procedures, such as abscess drains in radiology, possible prolonged hospital stay, possible diarrhea from removal of part of the colon, possible constipation from narcotics, possible bowel, bladder or sexual dysfunction if having rectal surgery, prolonged fatigue/weakness or appetite loss, possible early recurrence of of disease, possible complications of their medical problems such as heart disease or arrhythmias or lung problems, death (less than 1%). I believe the patient understands and wishes to proceed with the surgery.

## 2019-09-23 ENCOUNTER — Inpatient Hospital Stay: Payer: Medicaid Other

## 2019-09-23 ENCOUNTER — Inpatient Hospital Stay (HOSPITAL_BASED_OUTPATIENT_CLINIC_OR_DEPARTMENT_OTHER): Payer: Medicaid Other | Admitting: Hematology

## 2019-09-23 ENCOUNTER — Telehealth: Payer: Self-pay | Admitting: *Deleted

## 2019-09-23 ENCOUNTER — Other Ambulatory Visit: Payer: Self-pay

## 2019-09-23 ENCOUNTER — Encounter: Payer: Self-pay | Admitting: Hematology

## 2019-09-23 VITALS — BP 124/83 | HR 73 | Temp 98.3°F | Resp 18 | Ht 63.5 in | Wt 96.9 lb

## 2019-09-23 DIAGNOSIS — C19 Malignant neoplasm of rectosigmoid junction: Secondary | ICD-10-CM | POA: Diagnosis not present

## 2019-09-23 DIAGNOSIS — C20 Malignant neoplasm of rectum: Secondary | ICD-10-CM

## 2019-09-23 DIAGNOSIS — G893 Neoplasm related pain (acute) (chronic): Secondary | ICD-10-CM

## 2019-09-23 LAB — CBC WITH DIFFERENTIAL (CANCER CENTER ONLY)
Abs Immature Granulocytes: 0.01 10*3/uL (ref 0.00–0.07)
Basophils Absolute: 0 10*3/uL (ref 0.0–0.1)
Basophils Relative: 1 %
Eosinophils Absolute: 0.1 10*3/uL (ref 0.0–0.5)
Eosinophils Relative: 3 %
HCT: 41.5 % (ref 36.0–46.0)
Hemoglobin: 13.6 g/dL (ref 12.0–15.0)
Immature Granulocytes: 0 %
Lymphocytes Relative: 20 %
Lymphs Abs: 1.1 10*3/uL (ref 0.7–4.0)
MCH: 31.6 pg (ref 26.0–34.0)
MCHC: 32.8 g/dL (ref 30.0–36.0)
MCV: 96.3 fL (ref 80.0–100.0)
Monocytes Absolute: 0.5 10*3/uL (ref 0.1–1.0)
Monocytes Relative: 10 %
Neutro Abs: 3.8 10*3/uL (ref 1.7–7.7)
Neutrophils Relative %: 66 %
Platelet Count: 224 10*3/uL (ref 150–400)
RBC: 4.31 MIL/uL (ref 3.87–5.11)
RDW: 17.3 % — ABNORMAL HIGH (ref 11.5–15.5)
WBC Count: 5.6 10*3/uL (ref 4.0–10.5)
nRBC: 0 % (ref 0.0–0.2)

## 2019-09-23 LAB — CMP (CANCER CENTER ONLY)
ALT: 11 U/L (ref 0–44)
AST: 24 U/L (ref 15–41)
Albumin: 4 g/dL (ref 3.5–5.0)
Alkaline Phosphatase: 47 U/L (ref 38–126)
Anion gap: 14 (ref 5–15)
BUN: 18 mg/dL (ref 6–20)
CO2: 25 mmol/L (ref 22–32)
Calcium: 9.4 mg/dL (ref 8.9–10.3)
Chloride: 103 mmol/L (ref 98–111)
Creatinine: 0.74 mg/dL (ref 0.44–1.00)
GFR, Est AFR Am: >60 mL/min (ref >60)
GFR, Estimated: >60 mL/min (ref >60)
Glucose, Bld: 91 mg/dL (ref 70–99)
Potassium: 3.9 mmol/L (ref 3.5–5.1)
Sodium: 142 mmol/L (ref 135–145)
Total Bilirubin: 0.3 mg/dL (ref 0.3–1.2)
Total Protein: 7.5 g/dL (ref 6.5–8.1)

## 2019-09-23 LAB — MAGNESIUM: Magnesium: 2.1 mg/dL (ref 1.7–2.4)

## 2019-09-23 NOTE — Telephone Encounter (Signed)
Spoke with Izora Gala @ Dr. Myriam Jacobson, [facility]  per  pt's request for second opinion.  Faxed path reports and demographic info to [place] as requested.  Per Izora Gala, her office can see all other info in [place]. Nancy's    Phone    [phone]    ;     Fax    [phone].

## 2019-09-23 NOTE — Progress Notes (Signed)
[facility] PROGRESS NOTE  Patient Care Team: Karleen Hampshire., MD as PCP - General (Internal Medicine) Cordelia Poche, RN as Oncology Nurse Navigator Tish Men, MD as Medical Oncologist (Oncology)  HEME/ONC OVERVIEW: 1. 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 Marcello Moores of GI surgery, but would like to have a second opinion at [facility]; I have placed referral to Dr. Michaelle Copas according to the patient's request  -I emphasized with the patient that it is important to stay on schedule for surgery, whether she chooses to have surgery in [facility] or [place] -Pending the pathology from the surgery, we will determine if any adjuvant treatment is indicated   Cancer-related pain -Secondary to chemoradiation  -Localized to the rectum, exacerbated by bowel movement -Pain relatively adequately controlled with PRN Norco, Sitz bath and Aquaphor  -Continue the regimen above   No orders of the defined types were placed in this encounter.  All questions were answered. The patient knows to call the clinic with any problems, questions or concerns. No barriers to learning was detected.  A total of more than 25 minutes were spent face-to-face with the patient during this encounter and over half of that time was spent on  counseling and coordination of care as outlined above.   Tentatively return in 2 months to follow up pathology from surgery   Tish Men, MD 09/23/2019 11:19 AM  CHIEF COMPLAINT: "I am feeling a little better"  INTERVAL HISTORY: Ms. Halfhill returns to clinic for follow-up of rectal cancer s/p neoadjuvant chemoradiation with Xeloda.  Patient reports that her rectal irritation is still persistent, but overall improving.  She noticed few skin blisters just above her sacrum that ruptured, and she has been applying Aquaphor with improvement.  She also uses sitz bath and takes Norco approximately once today with improvement in the rectal irritation.  Her bowel movements are regular and soft.  Her abdominal cramping has improved.   She met with Dr. Marcello Moores of GI surgery recently and had initially been scheduled for surgical resection in mid- to late 10/2019.  However, she would like a second opinion at [facility] with Dr. Michaelle Copas.    REVIEW OF SYSTEMS:   Constitutional: ( - ) fevers, ( - )  chills , ( - ) night sweats Eyes: ( - ) blurriness of vision, ( - ) double vision, ( - ) watery eyes Ears, nose, mouth, throat, and face: ( - ) mucositis, ( - ) sore throat Respiratory: ( - ) cough, ( - ) dyspnea, ( - ) wheezes Cardiovascular: ( - ) palpitation, ( - ) chest discomfort, ( - ) lower extremity swelling Gastrointestinal:  ( - ) nausea, ( - ) heartburn, ( - ) change in bowel habits Skin: ( + ) abnormal skin rashes Lymphatics: ( - ) new lymphadenopathy, ( - ) easy bruising Neurological: ( - ) numbness, ( - )  tingling, ( - ) new weaknesses Behavioral/Psych: ( - ) mood change, ( - ) new changes  All other systems were reviewed with the patient and are negative.  SUMMARY OF ONCOLOGIC HISTORY: Oncology History  Rectal adenocarcinoma ([place])  06/25/2019 Initial Diagnosis   Colon cancer ([place])   06/25/2019 Procedure   Colonoscopy: - Hemorrhoids found on perianal exam. - One 9 mm polyp in the sigmoid colon,  removed with a cold snare. Resected and retrieved. - Malignant partially obstructing tumor in the recto-sigmoid colon. Biopsied. Tattooed. - Non-bleeding internal hemorrhoids. - The descending colon and distal transverse colon are normal.   06/30/2019 Imaging   CT chest, abdomen and pelvis w/ contrast:  IMPRESSION: 1. Extensive mural thickening in the proximal rectum compatible with the recently diagnosed rectal mass. There are several prominent but nonenlarged mesorectal lymph nodes associated with this which are nonspecific, but concerning for potential nodal disease. No other sites of distal metastatic disease confidently identified elsewhere in the chest, abdomen or pelvis. 2. A few scattered 1-3 mm peripheral predominant pulmonary nodules. These are nonspecific, but strongly favored to be benign. Attention at time of routine follow-up is recommended to ensure stability. 3. Old granulomatous disease, as above.   07/17/2019 Imaging   MRI pelvis w/ contrast:  IMPRESSION: Rectal adenocarcinoma T stage: 2   Rectal adenocarcinoma N stage:  0   Distance from tumor to the internal anal sphincter is 8.8 cm.   07/22/2019 Cancer Staging   Staging form: Colon and Rectum - Neuroendocine Tumors, AJCC 8th Edition - Clinical: Stage IIA (cT2, cN0, cM0) - Signed by Tish Men, MD on 07/22/2019   08/04/2019 -  Chemotherapy   The patient had [No matching medication found in this treatment plan]  for chemotherapy treatment.      I have reviewed the past medical history, past surgical history, social history and family history with the patient and they are unchanged from previous note.  ALLERGIES:  has No Known Allergies.  MEDICATIONS:  Current Outpatient Medications  Medication Sig Dispense Refill  . HYDROcodone-acetaminophen (NORCO) 5-325 MG tablet Take 1 tablet by mouth every 6 (six) hours as needed for moderate pain. 45 tablet 0  . hydrocortisone (ANUSOL-HC) 25 MG suppository Place 1  suppository (25 mg total) rectally 2 (two) times daily. 12 suppository 3  . loperamide (IMODIUM) 2 MG capsule Take 2 at diarrhea onset, then 1 every 2hr until 12hrs with no BM. May take 2 every 4hrs at night. If diarrhea recurs repeat. 100 capsule 1  . LORazepam (ATIVAN) 0.5 MG tablet Take 1 tablet (0.5 mg total) by mouth every 6 (six) hours as needed (Nausea or vomiting). 30 tablet 0  . omeprazole (PRILOSEC) 20 MG capsule Take 1 capsule (20 mg total) by mouth 2 (two) times daily before a meal. (Patient taking differently: Take 20 mg by mouth daily. ) 180 capsule 1  . ondansetron (ZOFRAN) 8 MG tablet Take 1 tablet (8 mg total) by mouth 2 (two) times daily as needed (Nausea or vomiting). 30 tablet 1  . prochlorperazine (COMPAZINE) 10 MG tablet Take 1 tablet (10 mg total) by mouth every 6 (six) hours as needed (Nausea or vomiting). 30 tablet 1   No current facility-administered medications for this visit.     PHYSICAL EXAMINATION: ECOG PERFORMANCE STATUS: 1 - Symptomatic but completely ambulatory  Today's Vitals   09/23/19 1109  BP: 124/83  Pulse: 73  Resp: 18  Temp: 98.3 F (36.8 C)  TempSrc: Temporal  SpO2: 100%  Weight: 96 lb 14.4 oz (44 kg)  Height: 5' 3.5" (1.613 m)   Body mass index is 16.9 kg/m.  Filed Weights   09/23/19 1109  Weight: 96 lb 14.4 oz (44 kg)    GENERAL: alert, no distress and comfortable EYES: conjunctiva are pink and non-injected, sclera clear OROPHARYNX: no exudate, no erythema; lips, buccal mucosa, and tongue normal  NECK: supple, non-tender LUNGS: clear to auscultation with normal breathing effort HEART: regular rate & rhythm and no murmurs and no lower extremity edema ABDOMEN: soft, non-tender, non-distended, normal bowel sounds Musculoskeletal: no cyanosis of digits and no clubbing  PSYCH: alert & oriented x 3, fluent speech NEURO: no focal motor/sensory deficits  LABORATORY DATA:  I have reviewed the data as listed    Component Value Date/Time    NA 137 09/09/2019 1410   K 3.9 09/09/2019 1410   CL 99 09/09/2019 1410   CO2 28 09/09/2019 1410   GLUCOSE 89 09/09/2019 1410   BUN 13 09/09/2019 1410   CREATININE 0.75 09/09/2019 1410   CALCIUM 8.9 09/09/2019 1410   PROT 6.9 09/09/2019 1410   ALBUMIN 3.8 09/09/2019 1410   AST 22 09/09/2019 1410   ALT 10 09/09/2019 1410   ALKPHOS 43 09/09/2019 1410   BILITOT 0.4 09/09/2019 1410   GFRNONAA >60 09/09/2019 1410   GFRAA >60 09/09/2019 1410    No results found for: SPEP, UPEP  Lab Results  Component Value Date   WBC 5.6 09/23/2019   NEUTROABS 3.8 09/23/2019   HGB 13.6 09/23/2019   HCT 41.5 09/23/2019   MCV 96.3 09/23/2019   PLT 224 09/23/2019      Chemistry      Component Value Date/Time   NA 137 09/09/2019 1410   K 3.9 09/09/2019 1410   CL 99 09/09/2019 1410   CO2 28 09/09/2019 1410   BUN 13 09/09/2019 1410   CREATININE 0.75 09/09/2019 1410      Component Value Date/Time   CALCIUM 8.9 09/09/2019 1410   ALKPHOS 43 09/09/2019 1410   AST 22 09/09/2019 1410   ALT 10 09/09/2019 1410   BILITOT 0.4 09/09/2019 1410       RADIOGRAPHIC STUDIES: I have personally reviewed the radiological images as listed below and agreed with the findings in the report. No results found.

## 2019-09-25 ENCOUNTER — Telehealth: Payer: Self-pay | Admitting: Hematology

## 2019-09-25 NOTE — Telephone Encounter (Signed)
I left a message regarding schedule  

## 2019-10-08 ENCOUNTER — Telehealth: Payer: Self-pay | Admitting: Radiation Oncology

## 2019-10-08 NOTE — Telephone Encounter (Signed)
  Radiation Oncology         [phone] ________________________________  Name: Sarah [name] MRN: [id number]  Date of Service: 10/08/2019  DOB: Apr 02, 1971  Post Treatment Telephone Note  Diagnosis:   At least Stage IIcT2N0M0 adenocarcinoma of the rectum  Interval Since Last Radiation:  4 weeks   08/04/2019-09/10/2019:  The rectum was treated to 45 Gy in 25 fractions and a 5.4 Gy boost in 3 fractions.  Narrative:  The patient was contacted today for routine follow-up. During treatment she did very well with radiotherapy and did not have significant desquamation.   Impression/Plan: 1. At least Stage IIcT2N0M0 adenocarcinoma of the rectum. She appears from other providers notes to be doing well and I discussed that we would be happy to continue to follow her as needed, but she will also continue to follow up with Dr. Maylon Peppers in medical oncology. She is also in the process of obtaining surgical opinions. I asked that she call back if she'd like to discuss her case and if she has any questions or concerns regarding radiation.   Carola Rhine, PAC

## 2019-10-09 ENCOUNTER — Telehealth: Payer: Self-pay | Admitting: *Deleted

## 2019-10-09 NOTE — Telephone Encounter (Signed)
Sarah [name] left a message stating Dr Cecil Cobbs is going to do colon surgery on 11/11/19 @ [facility]

## 2019-10-15 NOTE — Progress Notes (Signed)
  Radiation Oncology         [phone] ________________________________  Name: Sarah [name] MRN: [id number]  Date: 09/10/2019  DOB: Jan 05, 1971  End of Treatment Note  Diagnosis:   Rectal cancer    Cancer Staging Rectal adenocarcinoma ([place]) Staging form: Colon and Rectum - Neuroendocine Tumors, AJCC 8th Edition - Clinical: Stage IIA (cT2, cN0, cM0) - Signed by Tish Men, MD on 07/22/2019   Indication for treatment:  Curative       Radiation treatment dates:   08/04/19 - 09/10/19  Site/dose:    The patient was treated to the pelvis to a dose of 45 Gy at 1.8 Gy per fraction. This was accomplished using a 4 field 3-D conformal technique. The patient then received a boost to the tumor and adjacent high-risk regions for an additional 5.4 Gy at 1.8 gray per fraction. This was carried out using a coned-down 4 field approach. The patient's total dose was 50.4 Gy. Daily AlignRT was used on a daily basis to insure proper patient positioning and localization of critical targets/ structures. The patient received concurrent chemotherapy during the course of radiation treatment.  Narrative: The patient tolerated radiation treatment relatively well.     Plan: The patient has completed radiation treatment. The patient will return to radiation oncology clinic for routine followup in one month. I advised the patient to call or return sooner if they have any questions or concerns related to their recovery or treatment.   ------------------------------------------------  Jodelle Gross, MD, PhD

## 2019-11-11 ENCOUNTER — Other Ambulatory Visit: Payer: Self-pay | Admitting: Hematology

## 2019-11-11 ENCOUNTER — Telehealth: Payer: Self-pay | Admitting: Hematology

## 2019-11-11 ENCOUNTER — Telehealth: Payer: Self-pay | Admitting: *Deleted

## 2019-11-11 DIAGNOSIS — C20 Malignant neoplasm of rectum: Secondary | ICD-10-CM

## 2019-11-11 MED ORDER — LORAZEPAM 0.5 MG PO TABS: 0.5000 mg | ORAL_TABLET | Freq: Four times a day (QID) | ORAL | 0 refills | Status: DC | PRN

## 2019-11-11 NOTE — Telephone Encounter (Signed)
Returned patient's phone call regarding rescheduling 12/30 appointments, per patient's request appointment has moved to 01/13.

## 2019-11-11 NOTE — Telephone Encounter (Signed)
Due to the circumstance of positive COVID delaying her surgery, I have sent a one-time prescription of Ativan 10 tabs to her pharmacy. However, if she requires further medication management for anxiety, I would recommend her to contact her PCP for further management, as Ativan is not a long term choice for managing anxiety.  Thanks.  Dr. Maylon Peppers

## 2019-11-11 NOTE — Telephone Encounter (Signed)
Pt called and left message requesting anxiety prescription.  Spoke with pt and was informed that pt had tested for COVID pre surgery on Monday - with  Positive  results.  Stated her surgery has been delayed to  11/24/19. Pt requested something for anxiety.  Dr. Maylon Peppers notified. Informed pt to contact her PCP for anxiety meds if needed as per Dr. Lorette Ang instructions. Pt's   Phone    [phone].

## 2019-11-12 NOTE — Telephone Encounter (Signed)
Spoke with Sarah [name] and informed Sarah [name] of Dr. Lorette Ang instructions regarding anti anxiety meds.  Sarah [name] voiced understanding.

## 2019-11-25 ENCOUNTER — Other Ambulatory Visit: Payer: Medicaid Other

## 2019-11-25 ENCOUNTER — Ambulatory Visit: Payer: Medicaid Other | Admitting: Hematology

## 2019-11-27 HISTORY — PX: COLONOSCOPY: SHX174

## 2019-11-27 HISTORY — PX: POLYPECTOMY: SHX149

## 2019-12-04 ENCOUNTER — Telehealth: Payer: Self-pay | Admitting: Hematology

## 2019-12-04 ENCOUNTER — Telehealth: Payer: Self-pay

## 2019-12-04 ENCOUNTER — Telehealth: Payer: Self-pay | Admitting: *Deleted

## 2019-12-04 NOTE — Telephone Encounter (Signed)
R/s appt per 1/8 shc message - pt aware of appt date and time

## 2019-12-04 NOTE — Telephone Encounter (Signed)
Okay. Pls reschedule to 1/20 or 1/27. Labs prior to clinic appt please.   Thank you.  Dr. Maylon Peppers

## 2019-12-04 NOTE — Telephone Encounter (Signed)
Received voicemail from patient stating that she was currently in the hospital and she is scheduled to have an appointment with dr Maylon Peppers next week 12/09/19 @2  and she would need to push this appointment out another week. Made Dr Lorette Ang nurse Drucie Ip RN aware

## 2019-12-04 NOTE — Telephone Encounter (Signed)
Received vm message from pt stating she is currently hospitalized (she did not say where) and needed to re-schedule her appts for 12/09/19.  Scheduling message sent

## 2019-12-09 ENCOUNTER — Inpatient Hospital Stay: Payer: Medicaid Other | Admitting: Hematology

## 2019-12-09 ENCOUNTER — Inpatient Hospital Stay: Payer: Medicaid Other

## 2019-12-09 ENCOUNTER — Encounter: Payer: Self-pay | Admitting: *Deleted

## 2019-12-16 ENCOUNTER — Inpatient Hospital Stay: Payer: Medicaid Other | Attending: Hematology

## 2019-12-16 ENCOUNTER — Other Ambulatory Visit: Payer: Self-pay

## 2019-12-16 ENCOUNTER — Inpatient Hospital Stay (HOSPITAL_BASED_OUTPATIENT_CLINIC_OR_DEPARTMENT_OTHER): Payer: Medicaid Other | Admitting: Hematology

## 2019-12-16 ENCOUNTER — Encounter: Payer: Self-pay | Admitting: Hematology

## 2019-12-16 VITALS — BP 94/66 | HR 82 | Temp 97.3°F | Resp 16 | Ht 63.5 in | Wt 95.8 lb

## 2019-12-16 DIAGNOSIS — C20 Malignant neoplasm of rectum: Secondary | ICD-10-CM | POA: Diagnosis not present

## 2019-12-16 DIAGNOSIS — Z79899 Other long term (current) drug therapy: Secondary | ICD-10-CM | POA: Insufficient documentation

## 2019-12-16 DIAGNOSIS — R7989 Other specified abnormal findings of blood chemistry: Secondary | ICD-10-CM | POA: Insufficient documentation

## 2019-12-16 DIAGNOSIS — C19 Malignant neoplasm of rectosigmoid junction: Secondary | ICD-10-CM | POA: Diagnosis present

## 2019-12-16 DIAGNOSIS — G8918 Other acute postprocedural pain: Secondary | ICD-10-CM

## 2019-12-16 DIAGNOSIS — D473 Essential (hemorrhagic) thrombocythemia: Secondary | ICD-10-CM

## 2019-12-16 DIAGNOSIS — D75839 Thrombocytosis, unspecified: Secondary | ICD-10-CM

## 2019-12-16 LAB — CMP (CANCER CENTER ONLY)
ALT: 10 U/L (ref 0–44)
AST: 21 U/L (ref 15–41)
Albumin: 3.4 g/dL — ABNORMAL LOW (ref 3.5–5.0)
Alkaline Phosphatase: 61 U/L (ref 38–126)
Anion gap: 11 (ref 5–15)
BUN: 18 mg/dL (ref 6–20)
CO2: 26 mmol/L (ref 22–32)
Calcium: 8.7 mg/dL — ABNORMAL LOW (ref 8.9–10.3)
Chloride: 103 mmol/L (ref 98–111)
Creatinine: 0.71 mg/dL (ref 0.44–1.00)
GFR, Est AFR Am: >60 mL/min
GFR, Estimated: >60 mL/min
Glucose, Bld: 94 mg/dL (ref 70–99)
Potassium: 4 mmol/L (ref 3.5–5.1)
Sodium: 140 mmol/L (ref 135–145)
Total Bilirubin: <0.2 mg/dL — ABNORMAL LOW (ref 0.3–1.2)
Total Protein: 7.2 g/dL (ref 6.5–8.1)

## 2019-12-16 LAB — CBC WITH DIFFERENTIAL (CANCER CENTER ONLY)
Abs Immature Granulocytes: 0.01 K/uL (ref 0.00–0.07)
Basophils Absolute: 0.1 K/uL (ref 0.0–0.1)
Basophils Relative: 1 %
Eosinophils Absolute: 0.4 K/uL (ref 0.0–0.5)
Eosinophils Relative: 6 %
HCT: 41.1 % (ref 36.0–46.0)
Hemoglobin: 13.3 g/dL (ref 12.0–15.0)
Immature Granulocytes: 0 %
Lymphocytes Relative: 19 %
Lymphs Abs: 1.2 K/uL (ref 0.7–4.0)
MCH: 30.6 pg (ref 26.0–34.0)
MCHC: 32.4 g/dL (ref 30.0–36.0)
MCV: 94.5 fL (ref 80.0–100.0)
Monocytes Absolute: 0.6 K/uL (ref 0.1–1.0)
Monocytes Relative: 10 %
Neutro Abs: 3.8 K/uL (ref 1.7–7.7)
Neutrophils Relative %: 64 %
Platelet Count: 401 K/uL — ABNORMAL HIGH (ref 150–400)
RBC: 4.35 MIL/uL (ref 3.87–5.11)
RDW: 11.8 % (ref 11.5–15.5)
WBC Count: 6 K/uL (ref 4.0–10.5)
nRBC: 0 % (ref 0.0–0.2)

## 2019-12-16 LAB — MAGNESIUM: Magnesium: 2.1 mg/dL (ref 1.7–2.4)

## 2019-12-16 MED ORDER — HYDROCODONE-ACETAMINOPHEN 5-325 MG PO TABS: 1.0000 | ORAL_TABLET | Freq: Three times a day (TID) | ORAL | 0 refills | Status: DC | PRN

## 2019-12-16 NOTE — Progress Notes (Signed)
[place] OFFICE PROGRESS NOTE  Patient Care Team: Karleen Hampshire., MD as PCP - General (Internal Medicine) Tish Men, MD as Medical Oncologist (Oncology)  HEME/ONC OVERVIEW: 1. 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  -05/2019: colonoscopy showed a fungating, partially obstructing mass in the rectosigmoid colon, 13-19 cm from the anal verge; adenocarcinoma on bx -06/2019: CT CAP negative for mets; cT2N0 on pelvic MRI, 8.8cm from the anal sphincter -07/2019 - 09/10/2019: neoadjuvant chemoRT with Xeloda -Late 10/2019: LAR at [facility]  Path: adenocarcinoma, moderately differentiated, single small microscopic focus (0.1cm), near complete response; 0/14 LN-; pT2pN0 -On surveillance   TREATMENT REGIMEN:  08/04/2019 - present: neoadjuvant chemoRT with Xeloda, plan for ~5 and 1/2 weeks (tentative complete date 10/15)  11/24/2019: LAR at [facility]    On surveillance   ASSESSMENT & PLAN:   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  In summary, path showed near complete response in the rectal malignancy, with only a single microscopic focus of adenocarcinoma measuring 0.1cm.  The margins and lymph nodes were negative.   -I discussed the pathology results with the patient, as well as NCCN guideline -Based on the staging MRI pelvis results at diagnosis, she would have qualified for upfront surgical resection. If the final pathology showed pT1 or pT2,pN0 disease, she would not have required any adjuvant therapy.  However, the surgeon at that time requested to start neoadjuvant chemoradiation due to her concern about one subcentimeter pelvic LN. -Given the pT2pN0 on pathology and excellent response to neoadjuvant chemoradiation, there is no indication for adjuvant chemotherapy  -Surveillance plan:  Labs (including CEA) and exam q3-65months x 2 years,  then q61months for a total of 5 years  CT CAP q6-53months x 5 years   Thrombocytosis -Most likely reactive in the setting of recent surgery -Plts 401k today, minimally elevated -We will monitor it for now   Post-operative pain -Secondary to recent LAR -OTC Tylenol with minimal relief  -I have prescribed Norco 5/325, q8hrs PRN for pain -Given her hx of opioid-induced constipation, I recommended the patient to start daily Miralax, and if bowel movement remains firm, she should add a stimulant -I also encouraged her to follow up with her surgeon at [facility] next week for further pain management as needed   Orders Placed This Encounter  Procedures  . CBC with Differential (Cancer Center Only)    Standing Status:   Future    Standing Expiration Date:   01/19/2021  . CMP ([place] only)    Standing Status:   Future    Standing Expiration Date:   01/19/2021  . CEA (IN HOUSE-[facility])    Standing Status:   Future    Standing Expiration Date:   12/15/2020   The total time spent in the appointment was 45 minutes encounter with patients, including review of chart and various tests results, discussions about plan of care and coordination of care plan  All questions were answered. The patient knows to call the clinic with any problems, questions or concerns. No barriers to learning was detected.  Return in 3 months for labs and clinic appt.   Tish Men, MD 1/20/20214:12 PM  CHIEF COMPLAINT: "Tylenol is not helping with my pain"  INTERVAL HISTORY: Sarah [name] returns to clinic for follow-up of rectal adenocarcinoma.  She underwent LAR at [facility] in early 11/2019, and her post-op course was complicated by  slow GI motility.  She was able to resume bowel movements a few days after the surgery.  Since discharge, she has been taking only OTC Tylenol for post-op pain, primarily in the lower abdomen near the incision site, but she was not prescribed any opioid pain medication.   She denies any recurrent  hematochezia or melena.    REVIEW OF SYSTEMS:   Constitutional: ( - ) fevers, ( - )  chills , ( - ) night sweats Eyes: ( - ) blurriness of vision, ( - ) double vision, ( - ) watery eyes Ears, nose, mouth, throat, and face: ( - ) mucositis, ( - ) sore throat Respiratory: ( - ) cough, ( - ) dyspnea, ( - ) wheezes Cardiovascular: ( - ) palpitation, ( - ) chest discomfort, ( - ) lower extremity swelling Gastrointestinal:  ( - ) nausea, ( - ) heartburn, ( - ) change in bowel habits Skin: ( - ) abnormal skin rashes Lymphatics: ( - ) new lymphadenopathy, ( - ) easy bruising Neurological: ( - ) numbness, ( - ) tingling, ( - ) new weaknesses Behavioral/Psych: ( - ) mood change, ( - ) new changes  All other systems were reviewed with the patient and are negative.  SUMMARY OF ONCOLOGIC HISTORY: Oncology History  Rectal adenocarcinoma ([place])  06/25/2019 Initial Diagnosis   Colon cancer ([place])   06/25/2019 Procedure   Colonoscopy: - Hemorrhoids found on perianal exam. - One 9 mm polyp in the sigmoid colon, removed with a cold snare. Resected and retrieved. - Malignant partially obstructing tumor in the recto-sigmoid colon. Biopsied. Tattooed. - Non-bleeding internal hemorrhoids. - The descending colon and distal transverse colon are normal.   06/30/2019 Imaging   CT chest, abdomen and pelvis w/ contrast:  IMPRESSION: 1. Extensive mural thickening in the proximal rectum compatible with the recently diagnosed rectal mass. There are several prominent but nonenlarged mesorectal lymph nodes associated with this which are nonspecific, but concerning for potential nodal disease. No other sites of distal metastatic disease confidently identified elsewhere in the chest, abdomen or pelvis. 2. A few scattered 1-3 mm peripheral predominant pulmonary nodules. These are nonspecific, but strongly favored to be benign. Attention at time of routine follow-up is recommended to ensure stability. 3. Old  granulomatous disease, as above.   07/17/2019 Imaging   MRI pelvis w/ contrast:  IMPRESSION: Rectal adenocarcinoma T stage: 2   Rectal adenocarcinoma N stage:  0   Distance from tumor to the internal anal sphincter is 8.8 cm.   07/22/2019 Cancer Staging   Staging form: Colon and Rectum - Neuroendocine Tumors, AJCC 8th Edition - Clinical: Stage IIA (cT2, cN0, cM0) - Signed by Tish Men, MD on 07/22/2019   08/04/2019 -  Chemotherapy   The patient had [No matching medication found in this treatment plan]  for chemotherapy treatment.    11/24/2019 Pathologic Stage   SPECIMEN    Procedure:    Low anterior resection     Macroscopic Evaluation of Mesorectum:    Complete   TUMOR    Tumor Site:    Rectum       Tumor Location:    Entirely above the anterior peritoneal reflection     Histologic Type:    Adenocarcinoma     Histologic Grade:    G2: Moderately differentiated     Tumor Size:    Greatest dimension (Centimeters): 0.1cm, single small microscopic focus cm    Tumor Extension:    Tumor invades muscularis propria  Macroscopic Tumor Perforation:    Not identified     Lymphovascular Invasion:    Not identified     Perineural Invasion:    Not identified     Tumor Budding:    Number of tumor buds in one 'hotspot' field (total number in area = 0.785 mm2): 0       :    Low score (0-4)     Type of Polyp in Which Invasive Carcinoma Arose:    None identified     Treatment Effect:    Present       :    Single cells or rare small groups of cancer cells (near complete response, score 1)   MARGINS    Margins:    All margins are uninvolved by invasive carcinoma, high grade dysplasia / intramucosal carcinoma, and low grade dysplasia       Margins Examined:    Proximal       Margins Examined:    Distal       Margins Examined:    Radial (circumferential) or Mesenteric       Distance of Invasive Carcinoma from Closest Margin:    4 cm      Closest Margin:    Distal       Distance of Tumor from  Radial (circumferential) Margin:    Cannot be determined: Single small microscopic focue in muscularis propria       Distance of Tumor from Distal Margin:    4 cm  LYMPH NODES    Number of Lymph Nodes Involved:    0     Number of Lymph Nodes Examined:    14     Tumor Deposits:    Not identified   PATHOLOGIC STAGE CLASSIFICATION (pTNM, AJCC 8th Edition)        TNM Descriptors:    y (post-treatment)     Primary Tumor (pT):    pT2     Regional Lymph Nodes (pN):    pN0   ADDITIONAL FINDINGS    Additional Findings:    HYPERPLASTIC POLYPS      I have reviewed the past medical history, past surgical history, social history and family history with the patient and they are unchanged from previous note.  ALLERGIES:  has No Known Allergies.  MEDICATIONS:  Current Outpatient Medications  Medication Sig Dispense Refill  . acetaminophen (TYLENOL) 500 MG tablet Take by mouth.    Marland Kitchen omeprazole (PRILOSEC) 20 MG capsule Take 1 capsule (20 mg total) by mouth 2 (two) times daily before a meal. (Patient taking differently: Take 20 mg by mouth daily. ) 180 capsule 1  . HYDROcodone-acetaminophen (NORCO) 5-325 MG tablet Take 1 tablet by mouth every 8 (eight) hours as needed for moderate pain. 30 tablet 0  . hydrocortisone (ANUSOL-HC) 25 MG suppository Place 1 suppository (25 mg total) rectally 2 (two) times daily. (Patient not taking: Reported on 12/16/2019) 12 suppository 3  . loperamide (IMODIUM) 2 MG capsule Take 2 at diarrhea onset, then 1 every 2hr until 12hrs with no BM. May take 2 every 4hrs at night. If diarrhea recurs repeat. (Patient not taking: Reported on 12/16/2019) 100 capsule 1  . LORazepam (ATIVAN) 0.5 MG tablet Take 1 tablet (0.5 mg total) by mouth every 6 (six) hours as needed (Nausea or vomiting). (Patient not taking: Reported on 12/16/2019) 10 tablet 0  . ondansetron (ZOFRAN) 8 MG tablet Take 1 tablet (8 mg total) by mouth 2 (two) times daily as needed (Nausea or  vomiting). (Patient not  taking: Reported on 12/16/2019) 30 tablet 1  . prochlorperazine (COMPAZINE) 10 MG tablet Take 1 tablet (10 mg total) by mouth every 6 (six) hours as needed (Nausea or vomiting). (Patient not taking: Reported on 12/16/2019) 30 tablet 1   No current facility-administered medications for this visit.    PHYSICAL EXAMINATION: ECOG PERFORMANCE STATUS: 1 - Symptomatic but completely ambulatory  Today's Vitals   12/16/19 1538 12/16/19 1542  BP: 94/66   Pulse: 82   Resp: 16   Temp: (!) 97.3 F (36.3 C)   TempSrc: Temporal   SpO2: 100%   Weight: 95 lb 12.8 oz (43.5 kg)   Height: 5' 3.5" (1.613 m)   PainSc:  5    Body mass index is 16.7 kg/m.  Filed Weights   12/16/19 1538  Weight: 95 lb 12.8 oz (43.5 kg)    GENERAL: alert, uncomfortable due to abdominal pain  SKIN: abdominal incision wound healing well, no erythema or drainage  EYES: conjunctiva are pink and non-injected, sclera clear OROPHARYNX: no exudate, no erythema; lips, buccal mucosa, and tongue normal  NECK: supple, non-tender LUNGS: clear to auscultation with normal breathing effort HEART: regular rate & rhythm and no murmurs and no lower extremity edema ABDOMEN: soft, mild tenderness with palpation in the lower abdomen, non-distended Musculoskeletal: no cyanosis of digits and no clubbing  PSYCH: alert & oriented x 3, fluent speech  LABORATORY DATA:  I have reviewed the data as listed    Component Value Date/Time   NA 140 12/16/2019 1524   K 4.0 12/16/2019 1524   CL 103 12/16/2019 1524   CO2 26 12/16/2019 1524   GLUCOSE 94 12/16/2019 1524   BUN 18 12/16/2019 1524   CREATININE 0.71 12/16/2019 1524   CALCIUM 8.7 (L) 12/16/2019 1524   PROT 7.2 12/16/2019 1524   ALBUMIN 3.4 (L) 12/16/2019 1524   AST 21 12/16/2019 1524   ALT 10 12/16/2019 1524   ALKPHOS 61 12/16/2019 1524   BILITOT <0.2 (L) 12/16/2019 1524   GFRNONAA >60 12/16/2019 1524   GFRAA >60 12/16/2019 1524    No results found for: SPEP, UPEP  Lab  Results  Component Value Date   WBC 6.0 12/16/2019   NEUTROABS 3.8 12/16/2019   HGB 13.3 12/16/2019   HCT 41.1 12/16/2019   MCV 94.5 12/16/2019   PLT 401 (H) 12/16/2019      Chemistry      Component Value Date/Time   NA 140 12/16/2019 1524   K 4.0 12/16/2019 1524   CL 103 12/16/2019 1524   CO2 26 12/16/2019 1524   BUN 18 12/16/2019 1524   CREATININE 0.71 12/16/2019 1524      Component Value Date/Time   CALCIUM 8.7 (L) 12/16/2019 1524   ALKPHOS 61 12/16/2019 1524   AST 21 12/16/2019 1524   ALT 10 12/16/2019 1524   BILITOT <0.2 (L) 12/16/2019 1524       RADIOGRAPHIC STUDIES: I have personally reviewed the radiological images as listed below and agreed with the findings in the report. No results found.

## 2019-12-17 ENCOUNTER — Telehealth: Payer: Self-pay | Admitting: Hematology

## 2019-12-17 NOTE — Telephone Encounter (Signed)
I could not reach patient regarding schedule no voicemail

## 2020-01-22 ENCOUNTER — Other Ambulatory Visit: Payer: Self-pay | Admitting: Hematology

## 2020-01-22 DIAGNOSIS — C20 Malignant neoplasm of rectum: Secondary | ICD-10-CM

## 2020-01-25 ENCOUNTER — Telehealth: Payer: Self-pay | Admitting: Hematology

## 2020-01-25 NOTE — Telephone Encounter (Signed)
Scheduled appt per 2/27 schmessage - unable to reach pt . Left message with appt date and time

## 2020-02-01 ENCOUNTER — Inpatient Hospital Stay: Payer: Medicaid Other | Attending: Hematology | Admitting: Licensed Clinical Social Worker

## 2020-02-01 ENCOUNTER — Encounter: Payer: Self-pay | Admitting: Licensed Clinical Social Worker

## 2020-02-01 DIAGNOSIS — Z1379 Encounter for other screening for genetic and chromosomal anomalies: Secondary | ICD-10-CM

## 2020-02-01 DIAGNOSIS — Z8052 Family history of malignant neoplasm of bladder: Secondary | ICD-10-CM | POA: Diagnosis not present

## 2020-02-01 DIAGNOSIS — C20 Malignant neoplasm of rectum: Secondary | ICD-10-CM | POA: Diagnosis not present

## 2020-02-01 NOTE — Progress Notes (Signed)
REFERRING PROVIDER: Zhao, Yan, MD [address] [place],  [place] [address]  PRIMARY PROVIDER:  Furr, Sara M., MD  PRIMARY REASON FOR VISIT:  1. Rectal adenocarcinoma (HCC)   2. Family history of bladder cancer     I connected with [name] on 02/01/2020 at 12:50 PM EDT by MyChart video and verified that I am speaking with the correct person using two identifiers.    Patient location: home Provider location:   HISTORY OF PRESENT ILLNESS:   [name], a 48 y.o. female, was seen for a [facility] cancer genetics consultation at the request of Dr. Zhao due to a personal and family history of cancer.  [name] to clinic today to discuss the possibility of a hereditary predisposition to cancer, genetic testing, and to further clarify her future cancer risks, as well as potential cancer risks for family members.   In 2020, at the age of 48, [name] was diagnosed with rectal adenocarcinoma, normal MMR testing on the tumor.  This was treated with radiation, chemotherapy and she had surgery with Dr. Stitzenberg on 11/24/2019. She also sees Dr. Cirgliano and would like for him to have access to genetic test results.    CANCER HISTORY:  Oncology History  Rectal adenocarcinoma (HCC)  06/25/2019 Initial Diagnosis   Colon cancer (HCC)   06/25/2019 Procedure   Colonoscopy: - Hemorrhoids found on perianal exam. - One 9 mm polyp in the sigmoid colon, removed with a cold snare. Resected and retrieved. - Malignant partially obstructing tumor in the recto-sigmoid colon. Biopsied. Tattooed. - Non-bleeding internal hemorrhoids. - The descending colon and distal transverse colon are normal.   06/30/2019 Imaging   CT chest, abdomen and pelvis w/ contrast:  IMPRESSION: 1. Extensive mural thickening in the proximal rectum compatible with the recently diagnosed rectal mass. There are several prominent but nonenlarged mesorectal lymph nodes associated with this which are nonspecific, but  concerning for potential nodal disease. No other sites of distal metastatic disease confidently identified elsewhere in the chest, abdomen or pelvis. 2. A few scattered 1-3 mm peripheral predominant pulmonary nodules. These are nonspecific, but strongly favored to be benign. Attention at time of routine follow-up is recommended to ensure stability. 3. Old granulomatous disease, as above.   07/17/2019 Imaging   MRI pelvis w/ contrast:  IMPRESSION: Rectal adenocarcinoma T stage: 2   Rectal adenocarcinoma N stage:  0   Distance from tumor to the internal anal sphincter is 8.8 cm.   07/22/2019 Cancer Staging   Staging form: Colon and Rectum - Neuroendocine Tumors, AJCC 8th Edition - Clinical: Stage IIA (cT2, cN0, cM0) - Signed by Zhao, Yan, MD on 07/22/2019   08/04/2019 -  Chemotherapy   The patient had [No matching medication found in this treatment plan]  for chemotherapy treatment.    11/24/2019 Pathologic Stage   SPECIMEN    Procedure:    Low anterior resection     Macroscopic Evaluation of Mesorectum:    Complete   TUMOR    Tumor Site:    Rectum       Tumor Location:    Entirely above the anterior peritoneal reflection     Histologic Type:    Adenocarcinoma     Histologic Grade:    G2: Moderately differentiated     Tumor Size:    Greatest dimension (Centimeters): 0.1cm, single small microscopic focus cm    Tumor Extension:    Tumor invades muscularis propria     Macroscopic Tumor   Perforation:    Not identified     Lymphovascular Invasion:    Not identified     Perineural Invasion:    Not identified     Tumor Budding:    Number of tumor buds in one 'hotspot' field (total number in area = 0.785 mm2): 0       :    Low score (0-4)     Type of Polyp in Which Invasive Carcinoma Arose:    None identified     Treatment Effect:    Present       :    Single cells or rare small groups of cancer cells (near complete response, score 1)   MARGINS    Margins:    All margins are  uninvolved by invasive carcinoma, high grade dysplasia / intramucosal carcinoma, and low grade dysplasia       Margins Examined:    Proximal       Margins Examined:    Distal       Margins Examined:    Radial (circumferential) or Mesenteric       Distance of Invasive Carcinoma from Closest Margin:    4 cm      Closest Margin:    Distal       Distance of Tumor from Radial (circumferential) Margin:    Cannot be determined: Single small microscopic focue in muscularis propria       Distance of Tumor from Distal Margin:    4 cm  LYMPH NODES    Number of Lymph Nodes Involved:    0     Number of Lymph Nodes Examined:    14     Tumor Deposits:    Not identified   PATHOLOGIC STAGE CLASSIFICATION (pTNM, AJCC 8th Edition)        TNM Descriptors:    y (post-treatment)     Primary Tumor (pT):    pT2     Regional Lymph Nodes (pN):    pN0   ADDITIONAL FINDINGS    Additional Findings:    HYPERPLASTIC POLYPS       RISK FACTORS:  Menarche was at age 13.  First live birth at age 26.  OCP use for "short time" Ovaries intact: yes.  Hysterectomy: no.  Menopausal status: postmenopausal.  HRT use: 0 years. Colonoscopy: yes;2020 was first cscope. Mammogram within the last year: yes. Number of breast biopsies: 0. Up to date with pelvic exams: she reports she is due for one. Any excessive radiation exposure in the past: no  Past Medical History:  Diagnosis Date  . Family history of bladder cancer     Past Surgical History:  Procedure Laterality Date  . TUBAL LIGATION  2003    Social History   Socioeconomic History  . Marital status: Single    Spouse name: Not on file  . Number of children: Not on file  . Years of education: Not on file  . Highest education level: Not on file  Occupational History  . Not on file  Tobacco Use  . Smoking status: Former Smoker    Packs/day: 1.00    Types: Cigarettes  . Smokeless tobacco: Never Used  Substance and Sexual Activity  . Alcohol use:  Not Currently    Alcohol/week: 15.0 standard drinks    Types: 15 Standard drinks or equivalent per week  . Drug use: No  . Sexual activity: Not on file  Other Topics Concern  . Not on file  Social History Narrative  .   Not on file   Social Determinants of Health   Financial Resource Strain:   . Difficulty of Paying Living Expenses: Not on file  Food Insecurity:   . Worried About Running Out of Food in the Last Year: Not on file  . Ran Out of Food in the Last Year: Not on file  Transportation Needs: No Transportation Needs  . Lack of Transportation (Medical): No  . Lack of Transportation (Non-Medical): No  Physical Activity:   . Days of Exercise per Week: Not on file  . Minutes of Exercise per Session: Not on file  Stress:   . Feeling of Stress : Not on file  Social Connections:   . Frequency of Communication with Friends and Family: Not on file  . Frequency of Social Gatherings with Friends and Family: Not on file  . Attends Religious Services: Not on file  . Active Member of Clubs or Organizations: Not on file  . Attends Club or Organization Meetings: Not on file  . Marital Status: Not on file     FAMILY HISTORY:  We obtained a detailed, 4-generation family history.  Significant diagnoses are listed below: Family History  Problem Relation Age of Onset  . Bladder Cancer Maternal Grandfather   . Colon polyps Father   . Colon cancer Neg Hx   . Esophageal cancer Neg Hx   . Rectal cancer Neg Hx   . Stomach cancer Neg Hx    [name] has a son and 2 daughters, no cancers. She has one sister, 53, with no history of cancer.   [name] mother is living at 74 with no cancer history. Patient had 1 maternal uncle and he is passed away, no cancer. No known cancers in maternal cousins. Maternal grandmother had a brain tumor diagnosed in her 60s and died at 95. Maternal grandfather had bladder cancer at 79 and died at 81.   [name] father is living at 76 and has a history of  colon polyps, she believes possibly more than 10 cumulative polyps. Patient has 3 paternal aunts 2 paternal uncles, no cancers. No known cancers in paternal cousins. Paternal grandmother died in her 80s, grandfather also died in his 80s and had skin cancer.  [name] is unaware of previous family history of genetic testing for hereditary cancer risks. Patient's maternal ancestors are of European descent, and paternal ancestors are of European descent. There is no reported Ashkenazi Jewish ancestry. There is no known consanguinity.  GENETIC COUNSELING ASSESSMENT: [name] is a 48 y.o. female with a personal history of rectal cancer and family history of cancer which is somewhat suggestive of a hereditary cancer syndrome such as Lynch syndrome and predisposition to cancer. We, therefore, discussed and recommended the following at today's visit.   DISCUSSION: We discussed that 5 - 7% of colorectal cancer is hereditary, with most cases associated with Lynch syndrome. Given the normal MMR result on her tumor, it is unlikely she has Lynch syndrome. There are other genes that can be associated with hereditary colorectal cancer syndromes. We discussed that testing is beneficial for several reasons including knowing how to follow individuals after completing their treatment, and understand if other family members could be at risk for cancer and allow them to undergo genetic testing.   We reviewed the characteristics, features and inheritance patterns of hereditary cancer syndromes. We also discussed genetic testing, including the appropriate family members to test, the process of testing, insurance coverage and turn-around-time for results. We discussed the   implications of a negative, positive and/or variant of uncertain significant result. We recommended [name] pursue genetic testing for the [facility] Common Hereditary Cancers gene panel.   The Common Hereditary Cancers Panel offered by [facility] includes sequencing  and/or deletion duplication testing of the following 48 genes: APC, ATM, AXIN2, BARD1, BMPR1A, BRCA1, BRCA2, BRIP1, CDH1, CDKN2A (p14ARF), CDKN2A (p16INK4a), CKD4, CHEK2, CTNNA1, DICER1, EPCAM (Deletion/duplication testing only), GREM1 (promoter region deletion/duplication testing only), KIT, MEN1, MLH1, MSH2, MSH3, MSH6, MUTYH, NBN, NF1, NHTL1, PALB2, PDGFRA, PMS2, POLD1, POLE, PTEN, RAD50, RAD51C, RAD51D, RNF43, SDHB, SDHC, SDHD, SMAD4, SMARCA4. STK11, TP53, TSC1, TSC2, and VHL.  The following genes were evaluated for sequence changes only: SDHA and HOXB13 c.251G>A variant only.  Based on [name] personal and family history of cancer, she meets medical criteria for genetic testing. Despite that she meets criteria, she may still have an out of pocket cost.  PLAN: After considering the risks, benefits, and limitations, [name] provided informed consent to pursue genetic testing. A saliva sample was mailed to her home and the sample will be sent to [facility] for analysis of the Common Hereditary Cancers Panel. Results should be available within approximately 2-3 weeks' time, at which point they will be disclosed by telephone to [name], as will any additional recommendations warranted by these results. [name] will receive a summary of her genetic counseling visit and a copy of her results once available. This information will also be available in Epic.   [name] questions were answered to her satisfaction today. Our contact information was provided should additional questions or concerns arise. Thank you for the referral and allowing [place] to share in the care of your patient.   Faith Rogue, MS, [facility] Genetic Counselor [email or web address] Phone: [phone]  The patient was seen for a total of 30 minutes in face-to-face genetic counseling.  Drs. Magrinat, Lindi Adie and/or Burr Medico were available for discussion regarding this case.    _______________________________________________________________________ For Office Staff:  Number of people involved in session: 1 Was an Intern/ student involved with case: no

## 2020-02-29 ENCOUNTER — Telehealth: Payer: Self-pay | Admitting: Licensed Clinical Social Worker

## 2020-02-29 NOTE — Telephone Encounter (Signed)
Revealed negative genetic testing.  Revealed that a VUS in NBN was identified. This normal result is reassuring and indicates that it is unlikely Sarah [name]'s cancer is due to a hereditary cause.  It is unlikely that there is an increased risk of another cancer due to a mutation in one of these genes.  However, genetic testing is not perfect, and cannot definitively rule out a hereditary cause.  It will be important for her to keep in contact with genetics to learn if any additional testing may be needed in the future.

## 2020-03-01 ENCOUNTER — Encounter: Payer: Self-pay | Admitting: Licensed Clinical Social Worker

## 2020-03-01 ENCOUNTER — Ambulatory Visit: Payer: Self-pay | Admitting: Licensed Clinical Social Worker

## 2020-03-01 DIAGNOSIS — Z1379 Encounter for other screening for genetic and chromosomal anomalies: Secondary | ICD-10-CM | POA: Insufficient documentation

## 2020-03-01 DIAGNOSIS — Z8052 Family history of malignant neoplasm of bladder: Secondary | ICD-10-CM

## 2020-03-01 DIAGNOSIS — C20 Malignant neoplasm of rectum: Secondary | ICD-10-CM

## 2020-03-01 NOTE — Progress Notes (Signed)
HPI:  Sarah [name] was previously seen in the [facility] due to a personal and family history of cancer and concerns regarding a hereditary predisposition to cancer. Please refer to our prior cancer genetics clinic note for more information regarding our discussion, assessment and recommendations, at the time. Sarah [name] recent genetic test results were disclosed to her, as were recommendations warranted by these results. These results and recommendations are discussed in more detail below.  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 This was treated with radiation, chemotherapy and she had surgery with Dr. Cecil Cobbs on 11/24/2019. She also sees Dr. Cathleen Corti and would like for him to have access to genetic test results.   CANCER HISTORY:  Oncology History  Rectal adenocarcinoma ([place])  06/25/2019 Initial Diagnosis   Colon cancer ([place])   06/25/2019 Procedure   Colonoscopy: - Hemorrhoids found on perianal exam. - One 9 mm polyp in the sigmoid colon, removed with a cold snare. Resected and retrieved. - Malignant partially obstructing tumor in the recto-sigmoid colon. Biopsied. Tattooed. - Non-bleeding internal hemorrhoids. - The descending colon and distal transverse colon are normal.   06/30/2019 Imaging   CT chest, abdomen and pelvis w/ contrast:  IMPRESSION: 1. Extensive mural thickening in the proximal rectum compatible with the recently diagnosed rectal mass. There are several prominent but nonenlarged mesorectal lymph nodes associated with this which are nonspecific, but concerning for potential nodal disease. No other sites of distal metastatic disease confidently identified elsewhere in the chest, abdomen or pelvis. 2. A few scattered 1-3 mm peripheral predominant pulmonary nodules. These are nonspecific, but strongly favored to be benign. Attention at time of routine follow-up is recommended to  ensure stability. 3. Old granulomatous disease, as above.   07/17/2019 Imaging   MRI pelvis w/ contrast:  IMPRESSION: Rectal adenocarcinoma T stage: 2   Rectal adenocarcinoma N stage:  0   Distance from tumor to the internal anal sphincter is 8.8 cm.   07/22/2019 Cancer Staging   Staging form: Colon and Rectum - Neuroendocine Tumors, AJCC 8th Edition - Clinical: Stage IIA (cT2, cN0, cM0) - Signed by Tish Men, MD on 07/22/2019   08/04/2019 -  Chemotherapy   The patient had [No matching medication found in this treatment plan]  for chemotherapy treatment.    11/24/2019 Pathologic Stage   SPECIMEN    Procedure:    Low anterior resection     Macroscopic Evaluation of Mesorectum:    Complete   TUMOR    Tumor Site:    Rectum       Tumor Location:    Entirely above the anterior peritoneal reflection     Histologic Type:    Adenocarcinoma     Histologic Grade:    G2: Moderately differentiated     Tumor Size:    Greatest dimension (Centimeters): 0.1cm, single small microscopic focus cm    Tumor Extension:    Tumor invades muscularis propria     Macroscopic Tumor Perforation:    Not identified     Lymphovascular Invasion:    Not identified     Perineural Invasion:    Not identified     Tumor Budding:    Number of tumor buds in one 'hotspot' field (total number in area = 0.785 mm2): 0       :    Low score (0-4)     Type of Polyp in Which Invasive Carcinoma  Arose:    None identified     Treatment Effect:    Present       :    Single cells or rare small groups of cancer cells (near complete response, score 1)   MARGINS    Margins:    All margins are uninvolved by invasive carcinoma, high grade dysplasia / intramucosal carcinoma, and low grade dysplasia       Margins Examined:    Proximal       Margins Examined:    Distal       Margins Examined:    Radial (circumferential) or Mesenteric       Distance of Invasive Carcinoma from Closest Margin:    4 cm      Closest Margin:    Distal        Distance of Tumor from Radial (circumferential) Margin:    Cannot be determined: Single small microscopic focue in muscularis propria       Distance of Tumor from Distal Margin:    4 cm  LYMPH NODES    Number of Lymph Nodes Involved:    0     Number of Lymph Nodes Examined:    14     Tumor Deposits:    Not identified   PATHOLOGIC STAGE CLASSIFICATION (pTNM, AJCC 8th Edition)        TNM Descriptors:    y (post-treatment)     Primary Tumor (pT):    pT2     Regional Lymph Nodes (pN):    pN0   ADDITIONAL FINDINGS    Additional Findings:    HYPERPLASTIC POLYPS    02/28/2020 Genetic Testing   Negative genetic testing. No pathogenic variants identified on the [facility] Common Hereditary Cancers Panel. VUS in NBN called c.224G>C (p.Gly75Ala) identified. The report date is 02/28/2020.  The Common Hereditary Cancers Panel offered by [facility] includes sequencing and/or deletion duplication testing of the following 48 genes: APC, ATM, AXIN2, BARD1, BMPR1A, BRCA1, BRCA2, BRIP1, CDH1, CDKN2A (p14ARF), CDKN2A (p16INK4a), CKD4, CHEK2, CTNNA1, DICER1, EPCAM (Deletion/duplication testing only), GREM1 (promoter region deletion/duplication testing only), KIT, MEN1, MLH1, MSH2, MSH3, MSH6, MUTYH, NBN, NF1, NHTL1, PALB2, PDGFRA, PMS2, POLD1, POLE, PTEN, RAD50, RAD51C, RAD51D, RNF43, SDHB, SDHC, SDHD, SMAD4, SMARCA4. STK11, TP53, TSC1, TSC2, and VHL.  The following genes were evaluated for sequence changes only: SDHA and HOXB13 c.251G>A variant only.     FAMILY HISTORY:  We obtained a detailed, 4-generation family history.  Significant diagnoses are listed below: Family History  Problem Relation Age of Onset  . Bladder Cancer Maternal Grandfather   . Colon polyps Father   . Colon cancer Neg Hx   . Esophageal cancer Neg Hx   . Rectal cancer Neg Hx   . Stomach cancer Neg Hx     Sarah [name] has a son and 2 daughters, no cancers. She has one sister, 69, with no history of cancer.   Sarah [name] mother is living at  27 with no cancer history. Patient had 1 maternal uncle and he is passed away, no cancer. No known cancers in maternal cousins. Maternal grandmother had a brain tumor diagnosed in her 57s and died at 56. Maternal grandfather had bladder cancer at 55 and died at 30.   Sarah [name] father is living at 19 and has a history of colon polyps, she believes possibly more than 10 cumulative polyps. Patient has 3 paternal aunts 2 paternal uncles, no cancers. No known cancers in paternal cousins. Paternal grandmother died in her  73s, grandfather also died in his 43s and had skin cancer.  Sarah [name] is unaware of previous family history of genetic testing for hereditary cancer risks. Patient's maternal ancestors are of European descent, and paternal ancestors are of European descent. There is no reported Ashkenazi Jewish ancestry. There is no known consanguinity.   GENETIC TEST RESULTS: Genetic testing reported out on 02/28/2020 through the [facility] Common Hereditary cancer panel found no pathogenic mutations.   The Common Hereditary Cancers Panel offered by [facility] includes sequencing and/or deletion duplication testing of the following 48 genes: APC, ATM, AXIN2, BARD1, BMPR1A, BRCA1, BRCA2, BRIP1, CDH1, CDKN2A (p14ARF), CDKN2A (p16INK4a), CKD4, CHEK2, CTNNA1, DICER1, EPCAM (Deletion/duplication testing only), GREM1 (promoter region deletion/duplication testing only), KIT, MEN1, MLH1, MSH2, MSH3, MSH6, MUTYH, NBN, NF1, NHTL1, PALB2, PDGFRA, PMS2, POLD1, POLE, PTEN, RAD50, RAD51C, RAD51D, RNF43, SDHB, SDHC, SDHD, SMAD4, SMARCA4. STK11, TP53, TSC1, TSC2, and VHL.  The following genes were evaluated for sequence changes only: SDHA and HOXB13 c.251G>A variant only.  The test report has been scanned into EPIC and is located under the Molecular Pathology section of the Results Review tab.  A portion of the result report is included below for reference.     We discussed with Sarah [name] that because current genetic testing is  not perfect, it is possible there may be a gene mutation in one of these genes that current testing cannot detect, but that chance is small.  We also discussed, that there could be another gene that has not yet been discovered, or that we have not yet tested, that is responsible for the cancer diagnoses in the family. It is also possible there is a hereditary cause for the cancer in the family that Sarah [name] did not inherit and therefore was not identified in her testing.  Therefore, it is important to remain in touch with cancer genetics in the future so that we can continue to offer Sarah [name] the most up to date genetic testing.   Genetic testing did identify a variant of uncertain significance (VUS) was identified in the NBN gene called c.224G>C.  At this time, it is unknown if this variant is associated with increased cancer risk or if this is a normal finding, but most variants such as this get reclassified to being inconsequential. It should not be used to make medical management decisions. With time, we suspect the lab will determine the significance of this variant, if any. If we do learn more about it, we will try to contact Sarah [name] to discuss it further. However, it is important to stay in touch with [place] periodically and keep the address and phone number up to date.  ADDITIONAL GENETIC TESTING: We discussed with Sarah [name] that her genetic testing was fairly extensive.  If there are genes identified to increase cancer risk that can be analyzed in the future, we would be happy to discuss and coordinate this testing at that time.    CANCER SCREENING RECOMMENDATIONS: Sarah [name] test result is considered negative (normal).  This means that we have not identified a hereditary cause for her  personal and family history of cancer at this time. Most cancers happen by chance and this negative test suggests that her cancer may fall into this category.    While reassuring, this does not definitively rule out a  hereditary predisposition to cancer. It is still possible that there could be genetic mutations that are undetectable by current technology. There could be genetic mutations in genes that have  not been tested or identified to increase cancer risk.  Therefore, it is recommended she continue to follow the cancer management and screening guidelines provided by her oncology and primary healthcare provider.   An individual's cancer risk and medical management are not determined by genetic test results alone. Overall cancer risk assessment incorporates additional factors, including personal medical history, family history, and any available genetic information that may result in a personalized plan for cancer prevention and surveillance  RECOMMENDATIONS FOR FAMILY MEMBERS:  Relatives in this family might be at some increased risk of developing cancer, over the general population risk, simply due to the family history of cancer.  We recommended female relatives in this family have a yearly mammogram beginning at age 28, or 60 years younger than the earliest onset of cancer, an annual clinical breast exam, and perform monthly breast self-exams. Female relatives in this family should also have a gynecological exam as recommended by their primary provider. All family members should have a colonoscopy by age 68, or as directed by their physicians.  It is also possible there is a hereditary cause for the cancer/polyps in Ms. Barbary's family that she did not inherit and therefore was not identified in her.  Based on Ms. Labonte's family history, we recommended her father have genetic counseling and testing if he has had more than 10 colon polyps/adenomatous polyps. Ms. Esselman will let us know if we can be of any assistance in coordinating genetic counseling and/or testing for these family members.  FOLLOW-UP: Lastly, we discussed with Sarah [name] that cancer genetics is a rapidly advancing field and it is possible that new genetic  tests will be appropriate for her and/or her family members in the future. We encouraged her to remain in contact with cancer genetics on an annual basis so we can update her personal and family histories and let her know of advances in cancer genetics that may benefit this family.   Our contact number was provided. Sarah [name] questions were answered to her satisfaction, and she knows she is welcome to call us at anytime with additional questions or concerns.   Faith Rogue, MS, [facility] Genetic Counselor [email or web address] Phone: [phone]

## 2020-03-15 NOTE — Progress Notes (Signed)
[place] OFFICE PROGRESS NOTE  Patient Care Team: Patient, No Pcp Per as PCP - General (General Practice) Tish Men, MD as Medical Oncologist (Oncology)  HEME/ONC OVERVIEW: 1. 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  -05/2019: colonoscopy showed a fungating, partially obstructing mass in the rectosigmoid colon, 13-19 cm from the anal verge; adenocarcinoma on bx -06/2019: CT CAP negative for mets; cT2N0 on pelvic MRI, 8.8cm from the anal sphincter -07/2019 - 09/10/2019: neoadjuvant chemoRT with Xeloda -Late 10/2019: LAR at [facility]  Path: adenocarcinoma, moderately differentiated, single small microscopic focus (0.1cm), near complete response; 0/14 LN-; pT2pN0 -On surveillance   TREATMENT REGIMEN:  08/04/2019 - present: neoadjuvant chemoRT with Xeloda, plan for ~5 and 1/2 weeks (tentative complete date 10/15)  11/24/2019: LAR at [facility]    On surveillance   ASSESSMENT & PLAN:   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 Currently on surveillance.  -Clinically, patient denies any symptoms of recurrent disease  -I have reached out to Dr. Bryan Lemma to coordinate surveillance colonoscopy. -In addition, I have ordered CT CAP w/ contrast in early 05/2020 to monitor disease recurrence  -Surveillance plan:  Labs (including CEA) and exam q3-84month x 2 years, then q634monthfor a total of 5 years  CT CAP q6-1252month 5 years   Colonoscopy at the discretion of gastroenterology   Bowel urgency/frequency -Likely secondary to recent bowel surgery -I counseled the patient on the importance of maintaining soft BM's to avoid straining, and encouraged her to add Miralax at least q2-3days  Orders Placed This Encounter  Procedures  . CT CHEST W CONTRAST    Standing Status:   Future    Standing Expiration Date:   03/16/2021    Order Specific Question:   If  indicated for the ordered procedure, I authorize the administration of contrast media per Radiology protocol    Answer:   Yes    Order Specific Question:   Preferred imaging location?    Answer[facility] Order Specific Question:   Radiology Contrast Protocol - do NOT remove file path    Answer:   \\[email or web address]    Order Specific Question:   Is patient pregnant?    Answer:   No  . CT ABDOMEN PELVIS W CONTRAST    Standing Status:   Future    Standing Expiration Date:   03/16/2021    Order Specific Question:   If indicated for the ordered procedure, I authorize the administration of contrast media per Radiology protocol    Answer:   Yes    Order Specific Question:   Preferred imaging location?    Answer[facility] Order Specific Question:   Is Oral Contrast requested for this exam?    Answer:   Yes, Per Radiology protocol    Order Specific Question:   Radiology Contrast Protocol - do NOT remove file path    Answer:   \\[email or web address].[email or web address]    Order Specific Question:   Is patient pregnant?    Answer:   No  . CBC with Differential (Cancer Center Only)    Standing Status:   Future    Standing Expiration Date:   04/20/2021  . [facility]    Standing Status:   Future    Standing Expiration Date:   04/20/2021  . CEA (IN HOUSE-[facility])    Standing Status:  Future    Standing Expiration Date:   03/16/2021    The total time spent in the encounter was 30 minutes, including face-to-face time with the patient, review of various tests results, order additional studies/medications, documentation, and coordination of care plan.   All questions were answered. The patient knows to call the clinic with any problems, questions or concerns. No barriers to learning was detected.  Return in 3 months for labs, CT results and clinic appt.   Tish Men, MD 4/21/202111:38 AM  CHIEF COMPLAINT: "I am doing  okay"  INTERVAL HISTORY: Ms. Cathell returns to clinic for follow-up of history of rectosigmoid adenocarcinoma on surveillance.  Patient reports that she has periodic increased frequency and urgency of bowel movement, but she denies any nausea, vomiting, diarrhea, hematochezia, or melena.  She has to take MiraLAX periodically to maintain soft BMs, but she does not take it regularly.  She has some increased sensitivity to touch in her abdomen, but she denies any pain in the abdomen with ambulation or at rest.  She has not yet gone back to see her gastroenterologist.  She denies any other complaint today.  REVIEW OF SYSTEMS:   Constitutional: ( - ) fevers, ( - )  chills , ( - ) night sweats Eyes: ( - ) blurriness of vision, ( - ) double vision, ( - ) watery eyes Ears, nose, mouth, throat, and face: ( - ) mucositis, ( - ) sore throat Respiratory: ( - ) cough, ( - ) dyspnea, ( - ) wheezes Cardiovascular: ( - ) palpitation, ( - ) chest discomfort, ( - ) lower extremity swelling Gastrointestinal:  ( - ) nausea, ( - ) heartburn, ( + ) change in bowel habits Skin: ( - ) abnormal skin rashes Lymphatics: ( - ) new lymphadenopathy, ( - ) easy bruising Neurological: ( - ) numbness, ( - ) tingling, ( - ) new weaknesses Behavioral/Psych: ( - ) mood change, ( - ) new changes  All other systems were reviewed with the patient and are negative.  SUMMARY OF ONCOLOGIC HISTORY: Oncology History  Rectal adenocarcinoma ([place])  06/25/2019 Initial Diagnosis   Colon cancer ([place])   06/25/2019 Procedure   Colonoscopy: - Hemorrhoids found on perianal exam. - One 9 mm polyp in the sigmoid colon, removed with a cold snare. Resected and retrieved. - Malignant partially obstructing tumor in the recto-sigmoid colon. Biopsied. Tattooed. - Non-bleeding internal hemorrhoids. - The descending colon and distal transverse colon are normal.   06/30/2019 Imaging   CT chest, abdomen and pelvis w/ contrast:  IMPRESSION: 1.  Extensive mural thickening in the proximal rectum compatible with the recently diagnosed rectal mass. There are several prominent but nonenlarged mesorectal lymph nodes associated with this which are nonspecific, but concerning for potential nodal disease. No other sites of distal metastatic disease confidently identified elsewhere in the chest, abdomen or pelvis. 2. A few scattered 1-3 mm peripheral predominant pulmonary nodules. These are nonspecific, but strongly favored to be benign. Attention at time of routine follow-up is recommended to ensure stability. 3. Old granulomatous disease, as above.   07/17/2019 Imaging   MRI pelvis w/ contrast:  IMPRESSION: Rectal adenocarcinoma T stage: 2   Rectal adenocarcinoma N stage:  0   Distance from tumor to the internal anal sphincter is 8.8 cm.   07/22/2019 Cancer Staging   Staging form: Colon and Rectum - Neuroendocine Tumors, AJCC 8th Edition - Clinical: Stage IIA (cT2, cN0, cM0) - Signed by Tish Men, MD on  07/22/2019   08/04/2019 -  Chemotherapy   The patient had [No matching medication found in this treatment plan]  for chemotherapy treatment.    11/24/2019 Pathologic Stage   SPECIMEN    Procedure:    Low anterior resection     Macroscopic Evaluation of Mesorectum:    Complete   TUMOR    Tumor Site:    Rectum       Tumor Location:    Entirely above the anterior peritoneal reflection     Histologic Type:    Adenocarcinoma     Histologic Grade:    G2: Moderately differentiated     Tumor Size:    Greatest dimension (Centimeters): 0.1cm, single small microscopic focus cm    Tumor Extension:    Tumor invades muscularis propria     Macroscopic Tumor Perforation:    Not identified     Lymphovascular Invasion:    Not identified     Perineural Invasion:    Not identified     Tumor Budding:    Number of tumor buds in one 'hotspot' field (total number in area = 0.785 mm2): 0       :    Low score (0-4)     Type of Polyp in Which Invasive  Carcinoma Arose:    None identified     Treatment Effect:    Present       :    Single cells or rare small groups of cancer cells (near complete response, score 1)   MARGINS    Margins:    All margins are uninvolved by invasive carcinoma, high grade dysplasia / intramucosal carcinoma, and low grade dysplasia       Margins Examined:    Proximal       Margins Examined:    Distal       Margins Examined:    Radial (circumferential) or Mesenteric       Distance of Invasive Carcinoma from Closest Margin:    4 cm      Closest Margin:    Distal       Distance of Tumor from Radial (circumferential) Margin:    Cannot be determined: Single small microscopic focue in muscularis propria       Distance of Tumor from Distal Margin:    4 cm  LYMPH NODES    Number of Lymph Nodes Involved:    0     Number of Lymph Nodes Examined:    14     Tumor Deposits:    Not identified   PATHOLOGIC STAGE CLASSIFICATION (pTNM, AJCC 8th Edition)        TNM Descriptors:    y (post-treatment)     Primary Tumor (pT):    pT2     Regional Lymph Nodes (pN):    pN0   ADDITIONAL FINDINGS    Additional Findings:    HYPERPLASTIC POLYPS    02/28/2020 Genetic Testing   Negative genetic testing. No pathogenic variants identified on the [facility] Common Hereditary Cancers Panel. VUS in NBN called c.224G>C (p.Gly75Ala) identified. The report date is 02/28/2020.  The Common Hereditary Cancers Panel offered by [facility] includes sequencing and/or deletion duplication testing of the following 48 genes: APC, ATM, AXIN2, BARD1, BMPR1A, BRCA1, BRCA2, BRIP1, CDH1, CDKN2A (p14ARF), CDKN2A (p16INK4a), CKD4, CHEK2, CTNNA1, DICER1, EPCAM (Deletion/duplication testing only), GREM1 (promoter region deletion/duplication testing only), KIT, MEN1, MLH1, MSH2, MSH3, MSH6, MUTYH, NBN, NF1, NHTL1, PALB2, PDGFRA, PMS2, POLD1, POLE, PTEN, RAD50, RAD51C, RAD51D, RNF43, SDHB, SDHC,  SDHD, SMAD4, SMARCA4. STK11, TP53, TSC1, TSC2, and VHL.  The following genes were  evaluated for sequence changes only: SDHA and HOXB13 c.251G>A variant only.     I have reviewed the past medical history, past surgical history, social history and family history with the patient and they are unchanged from previous note.  ALLERGIES:  has No Known Allergies.  MEDICATIONS:  Current Outpatient Medications  Medication Sig Dispense Refill  . omeprazole (PRILOSEC) 20 MG capsule Take 1 capsule (20 mg total) by mouth 2 (two) times daily before a meal. (Patient taking differently: Take 20 mg by mouth daily. ) 180 capsule 1  . acetaminophen (TYLENOL) 500 MG tablet Take by mouth.    Marland Kitchen HYDROcodone-acetaminophen (NORCO) 5-325 MG tablet Take 1 tablet by mouth every 8 (eight) hours as needed for moderate pain. (Patient not taking: Reported on 03/16/2020) 30 tablet 0  . hydrocortisone (ANUSOL-HC) 25 MG suppository Place 1 suppository (25 mg total) rectally 2 (two) times daily. (Patient not taking: Reported on 12/16/2019) 12 suppository 3  . loperamide (IMODIUM) 2 MG capsule Take 2 at diarrhea onset, then 1 every 2hr until 12hrs with no BM. May take 2 every 4hrs at night. If diarrhea recurs repeat. (Patient not taking: Reported on 12/16/2019) 100 capsule 1  . LORazepam (ATIVAN) 0.5 MG tablet Take 1 tablet (0.5 mg total) by mouth every 6 (six) hours as needed (Nausea or vomiting). (Patient not taking: Reported on 12/16/2019) 10 tablet 0  . ondansetron (ZOFRAN) 8 MG tablet Take 1 tablet (8 mg total) by mouth 2 (two) times daily as needed (Nausea or vomiting). (Patient not taking: Reported on 12/16/2019) 30 tablet 1  . prochlorperazine (COMPAZINE) 10 MG tablet Take 1 tablet (10 mg total) by mouth every 6 (six) hours as needed (Nausea or vomiting). (Patient not taking: Reported on 12/16/2019) 30 tablet 1   No current facility-administered medications for this visit.    PHYSICAL EXAMINATION: ECOG PERFORMANCE STATUS: 0 - Asymptomatic  Today's Vitals   03/16/20 1116 03/16/20 1119  BP:  117/85   Pulse:  83  Resp:  20  Temp:  98.7 F (37.1 C)  TempSrc:  Temporal  SpO2:  100%  Weight:  100 lb 12.8 oz (45.7 kg)  Height:  5' 3.5" (1.613 m)  PainSc: 0-No pain    Body mass index is 17.58 kg/m.  Filed Weights   03/16/20 1119  Weight: 100 lb 12.8 oz (45.7 kg)    GENERAL: alert, no distress and comfortable SKIN: skin color, texture, turgor are normal, no rashes or significant lesions EYES: conjunctiva are pink and non-injected, sclera clear OROPHARYNX: no exudate, no erythema; lips, buccal mucosa, and tongue normal  NECK: supple, non-tender LUNGS: clear to auscultation with normal breathing effort HEART: regular rate & rhythm and no murmurs and no lower extremity edema ABDOMEN: soft, non-tender, non-distended, normal bowel sounds Musculoskeletal: no cyanosis of digits and no clubbing  PSYCH: alert & oriented x 3, fluent speech  LABORATORY DATA:  I have reviewed the data as listed    Component Value Date/Time   NA 140 12/16/2019 1524   K 4.0 12/16/2019 1524   CL 103 12/16/2019 1524   CO2 26 12/16/2019 1524   GLUCOSE 94 12/16/2019 1524   BUN 18 12/16/2019 1524   CREATININE 0.71 12/16/2019 1524   CALCIUM 8.7 (L) 12/16/2019 1524   PROT 7.2 12/16/2019 1524   ALBUMIN 3.4 (L) 12/16/2019 1524   AST 21 12/16/2019 1524   ALT 10 12/16/2019 1524  ALKPHOS 61 12/16/2019 1524   BILITOT <0.2 (L) 12/16/2019 1524   GFRNONAA >60 12/16/2019 1524   GFRAA >60 12/16/2019 1524    No results found for: SPEP, UPEP  Lab Results  Component Value Date   WBC 4.7 03/16/2020   NEUTROABS 3.1 03/16/2020   HGB 13.7 03/16/2020   HCT 42.4 03/16/2020   MCV 93.4 03/16/2020   PLT 256 03/16/2020      Chemistry      Component Value Date/Time   NA 140 12/16/2019 1524   K 4.0 12/16/2019 1524   CL 103 12/16/2019 1524   CO2 26 12/16/2019 1524   BUN 18 12/16/2019 1524   CREATININE 0.71 12/16/2019 1524      Component Value Date/Time   CALCIUM 8.7 (L) 12/16/2019 1524   ALKPHOS 61  12/16/2019 1524   AST 21 12/16/2019 1524   ALT 10 12/16/2019 1524   BILITOT <0.2 (L) 12/16/2019 1524       RADIOGRAPHIC STUDIES: I have personally reviewed the radiological images as listed below and agreed with the findings in the report. No results found.

## 2020-03-16 ENCOUNTER — Other Ambulatory Visit: Payer: Self-pay

## 2020-03-16 ENCOUNTER — Inpatient Hospital Stay: Payer: Medicaid Other | Attending: Hematology

## 2020-03-16 ENCOUNTER — Telehealth: Payer: Self-pay | Admitting: Hematology

## 2020-03-16 ENCOUNTER — Encounter: Payer: Self-pay | Admitting: Hematology

## 2020-03-16 ENCOUNTER — Inpatient Hospital Stay (HOSPITAL_BASED_OUTPATIENT_CLINIC_OR_DEPARTMENT_OTHER): Payer: Medicaid Other | Admitting: Hematology

## 2020-03-16 VITALS — BP 117/85 | HR 83 | Temp 98.7°F | Resp 20 | Ht 63.5 in | Wt 100.8 lb

## 2020-03-16 DIAGNOSIS — C19 Malignant neoplasm of rectosigmoid junction: Secondary | ICD-10-CM | POA: Insufficient documentation

## 2020-03-16 DIAGNOSIS — D125 Benign neoplasm of sigmoid colon: Secondary | ICD-10-CM | POA: Insufficient documentation

## 2020-03-16 DIAGNOSIS — Z79899 Other long term (current) drug therapy: Secondary | ICD-10-CM | POA: Diagnosis not present

## 2020-03-16 DIAGNOSIS — C20 Malignant neoplasm of rectum: Secondary | ICD-10-CM

## 2020-03-16 LAB — CBC WITH DIFFERENTIAL (CANCER CENTER ONLY)
Abs Immature Granulocytes: 0.01 10*3/uL (ref 0.00–0.07)
Basophils Absolute: 0 10*3/uL (ref 0.0–0.1)
Basophils Relative: 1 %
Eosinophils Absolute: 0.1 10*3/uL (ref 0.0–0.5)
Eosinophils Relative: 2 %
HCT: 42.4 % (ref 36.0–46.0)
Hemoglobin: 13.7 g/dL (ref 12.0–15.0)
Immature Granulocytes: 0 %
Lymphocytes Relative: 25 %
Lymphs Abs: 1.2 10*3/uL (ref 0.7–4.0)
MCH: 30.2 pg (ref 26.0–34.0)
MCHC: 32.3 g/dL (ref 30.0–36.0)
MCV: 93.4 fL (ref 80.0–100.0)
Monocytes Absolute: 0.4 10*3/uL (ref 0.1–1.0)
Monocytes Relative: 8 %
Neutro Abs: 3.1 10*3/uL (ref 1.7–7.7)
Neutrophils Relative %: 64 %
Platelet Count: 256 10*3/uL (ref 150–400)
RBC: 4.54 MIL/uL (ref 3.87–5.11)
RDW: 13.5 % (ref 11.5–15.5)
WBC Count: 4.7 10*3/uL (ref 4.0–10.5)
nRBC: 0 % (ref 0.0–0.2)

## 2020-03-16 LAB — CMP (CANCER CENTER ONLY)
ALT: 15 U/L (ref 0–44)
AST: 27 U/L (ref 15–41)
Albumin: 4 g/dL (ref 3.5–5.0)
Alkaline Phosphatase: 60 U/L (ref 38–126)
Anion gap: 9 (ref 5–15)
BUN: 13 mg/dL (ref 6–20)
CO2: 28 mmol/L (ref 22–32)
Calcium: 9 mg/dL (ref 8.9–10.3)
Chloride: 103 mmol/L (ref 98–111)
Creatinine: 0.8 mg/dL (ref 0.44–1.00)
GFR, Est AFR Am: >60 mL/min (ref >60)
GFR, Estimated: >60 mL/min (ref >60)
Glucose, Bld: 82 mg/dL (ref 70–99)
Potassium: 4 mmol/L (ref 3.5–5.1)
Sodium: 140 mmol/L (ref 135–145)
Total Bilirubin: 0.3 mg/dL (ref 0.3–1.2)
Total Protein: 7.3 g/dL (ref 6.5–8.1)

## 2020-03-16 LAB — MAGNESIUM: Magnesium: 2.1 mg/dL (ref 1.7–2.4)

## 2020-03-16 LAB — CEA (IN HOUSE-CHCC): CEA (CHCC-In House): <1 ng/mL (ref 0.00–5.00)

## 2020-03-16 NOTE — Telephone Encounter (Signed)
Scheduled appt per 4/21 los - pt aware - per pt , no print out needed.

## 2020-06-15 ENCOUNTER — Inpatient Hospital Stay: Payer: Medicaid Other | Admitting: Hematology and Oncology

## 2020-06-15 ENCOUNTER — Inpatient Hospital Stay: Payer: Medicaid Other

## 2020-07-13 ENCOUNTER — Other Ambulatory Visit: Payer: Self-pay | Admitting: Orthopedic Surgery

## 2020-07-13 DIAGNOSIS — M533 Sacrococcygeal disorders, not elsewhere classified: Secondary | ICD-10-CM

## 2020-08-07 ENCOUNTER — Ambulatory Visit
Admission: RE | Admit: 2020-08-07 | Discharge: 2020-08-07 | Disposition: A | Discharge status: Home / Self Care | Payer: Medicaid Other | Source: Ambulatory Visit | Attending: Orthopedic Surgery | Admitting: Orthopedic Surgery

## 2020-08-07 DIAGNOSIS — M533 Sacrococcygeal disorders, not elsewhere classified: Secondary | ICD-10-CM

## 2020-10-04 ENCOUNTER — Encounter: Payer: Self-pay | Admitting: Gastroenterology

## 2020-11-03 ENCOUNTER — Other Ambulatory Visit: Payer: Self-pay

## 2020-11-03 ENCOUNTER — Ambulatory Visit (AMBULATORY_SURGERY_CENTER): Payer: Self-pay | Admitting: *Deleted

## 2020-11-03 VITALS — Ht 63.5 in | Wt 103.0 lb

## 2020-11-03 DIAGNOSIS — Z86010 Personal history of colonic polyps: Secondary | ICD-10-CM

## 2020-11-03 DIAGNOSIS — Z85038 Personal history of other malignant neoplasm of large intestine: Secondary | ICD-10-CM

## 2020-11-03 MED ORDER — NA SULFATE-K SULFATE-MG SULF 17.5-3.13-1.6 GM/177ML PO SOLN: ORAL | 0 refills | Status: DC

## 2020-11-03 NOTE — Progress Notes (Signed)
Patient is here in-person for PV. Patient denies any allergies to eggs or soy. Patient denies any problems with anesthesia/sedation. Patient denies any oxygen use at home. Patient denies taking any diet/weight loss medications or blood thinners. Patient is not being treated for MRSA or C-diff. Patient is aware of our care-partner policy and [id number] safety protocol. EMMI education assigned to the patient for the procedure, sent to [place].   COVID-19 vaccines completed on 02/27/20, per patient.  2 day prep due to constipation issues.

## 2020-11-13 IMAGING — MR MRI PELVIS WITHOUT AND WITH CONTRAST
4 of 7 series · 26 of 48 positions shown · IV contrast (gadavist)
Comparison: CT 06/30/2019

CLINICAL DATA: Malignant neoplasm of rectum.

EXAM:
MRI PELVIS WITHOUT AND WITH CONTRAST
TECHNIQUE: Multiplanar multisequence MR imaging of the pelvis was performed
both before and after administration of intravenous contrast. Small
amount of US gel was administered per rectum to optimize tumor
evaluation.
CONTRAST:  4 cc Gadavist

[Series 2: T2 · sagittal · 3.0mm · 0.75mm/px · 6 of 35 slices shown (1 of 3)]
[im 1/35]
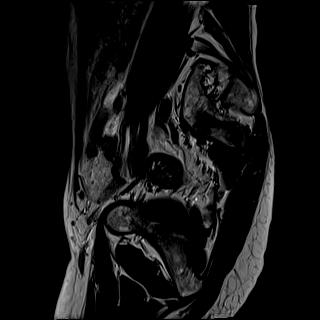
[im 7/35]
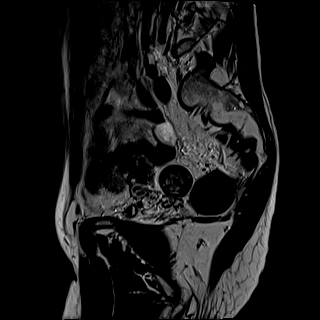
[im 14/35]
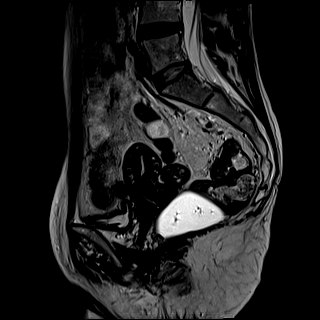
[im 21/35]
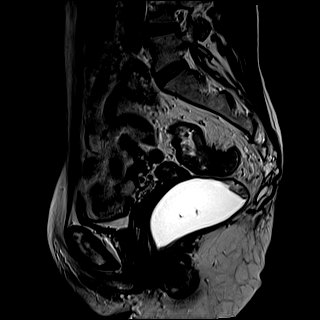
[im 28/35]
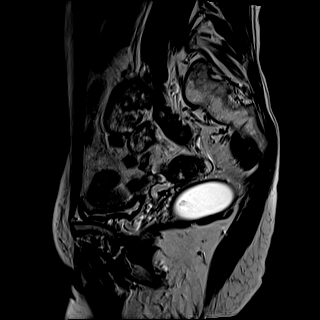
[im 35/35]
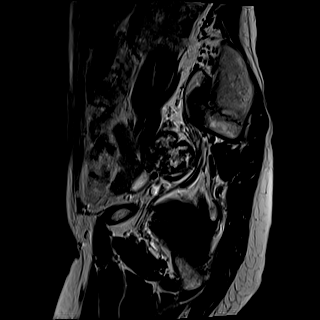

[Series 3: T2 · axial · 3.0mm · 0.69mm/px · z∈[-41,+83]mm · 8 of 45 slices shown (2 of 3)]
[im 1/45]
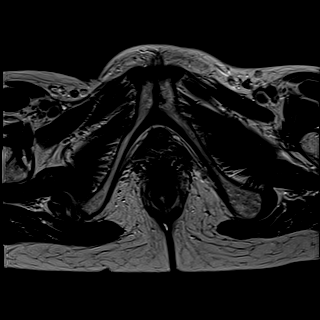
[im 7/45]
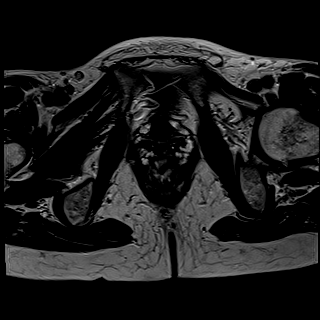
[im 13/45]
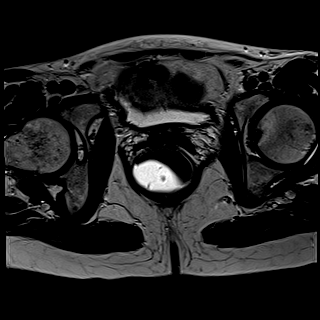
[im 19/45]
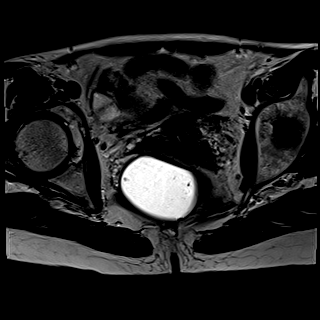
[im 26/45]
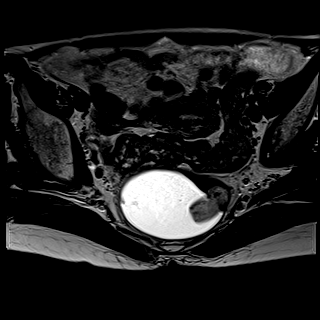
[im 32/45]
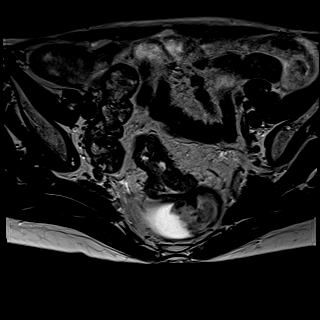
[im 38/45]
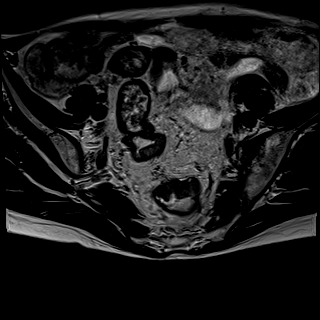
[im 45/45]
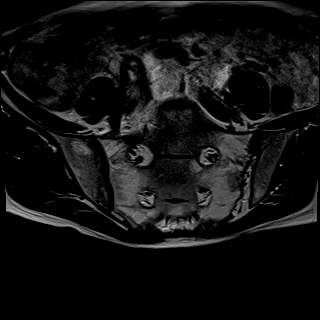

[Series 4: T2 · coronal · 3.0mm · 0.75mm/px · 8 of 45 slices shown (3 of 3)]
[im 1/45]
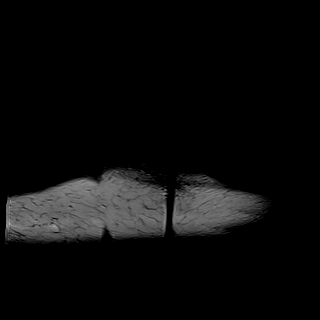
[im 7/45]
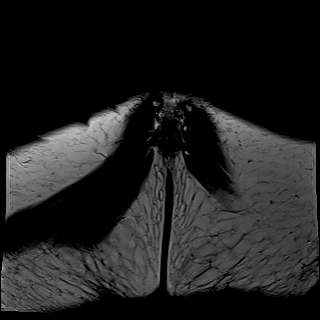
[im 13/45]
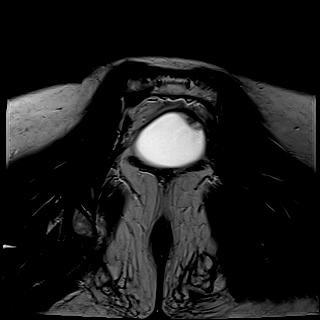
[im 19/45]
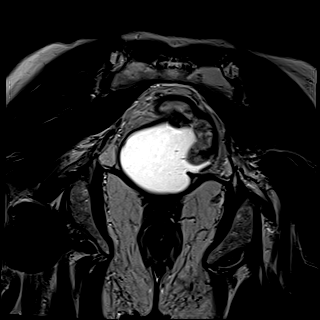
[im 26/45]
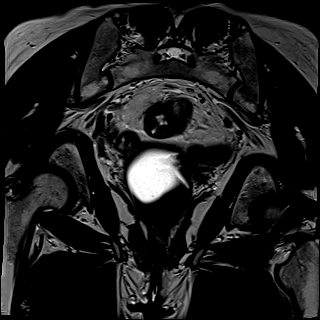
[im 32/45]
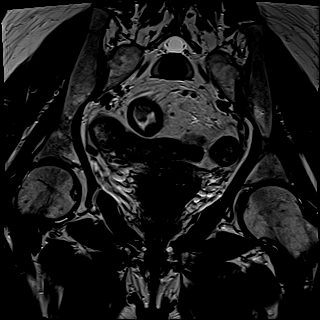
[im 38/45]
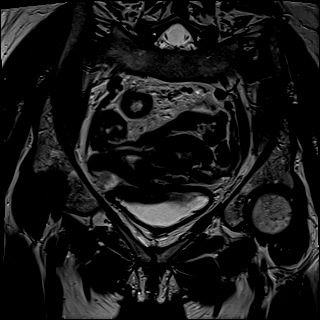
[im 45/45]
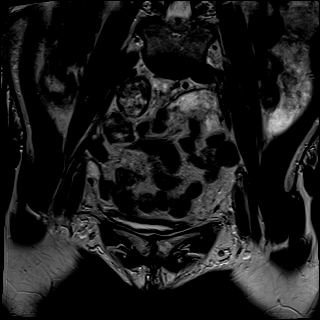

[Series 5: T1 fat-sat · axial · 5.0mm · 0.52mm/px · z∈[-59,+144]mm · 4 of 37 slices shown]
[im 1/37]
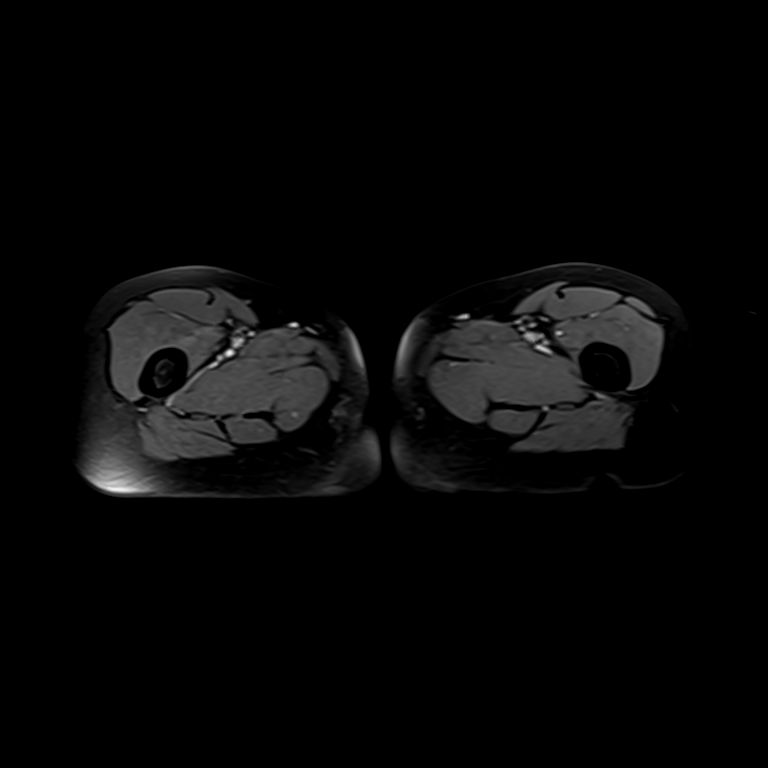
[im 8/37]
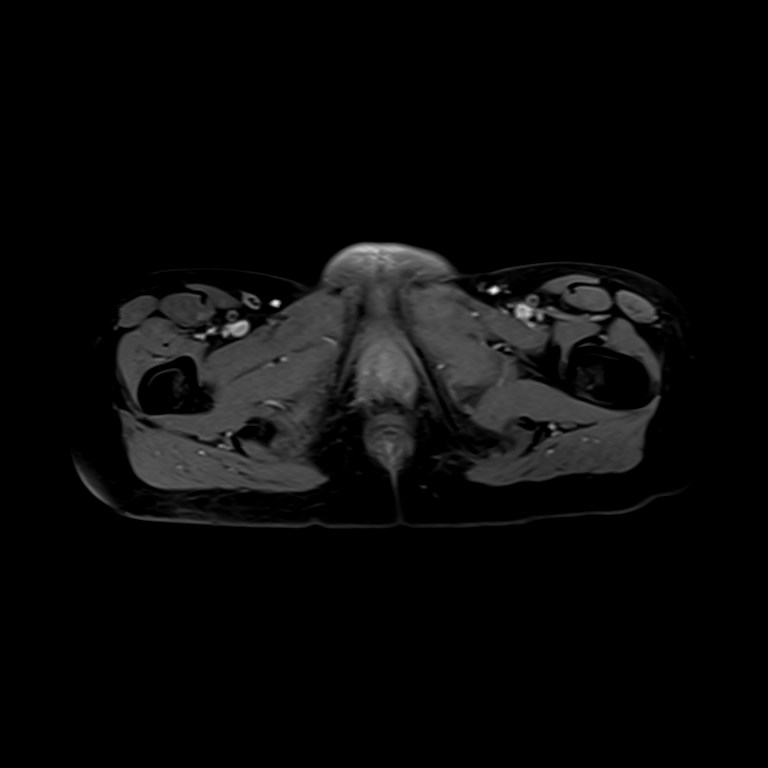
[im 22/37]
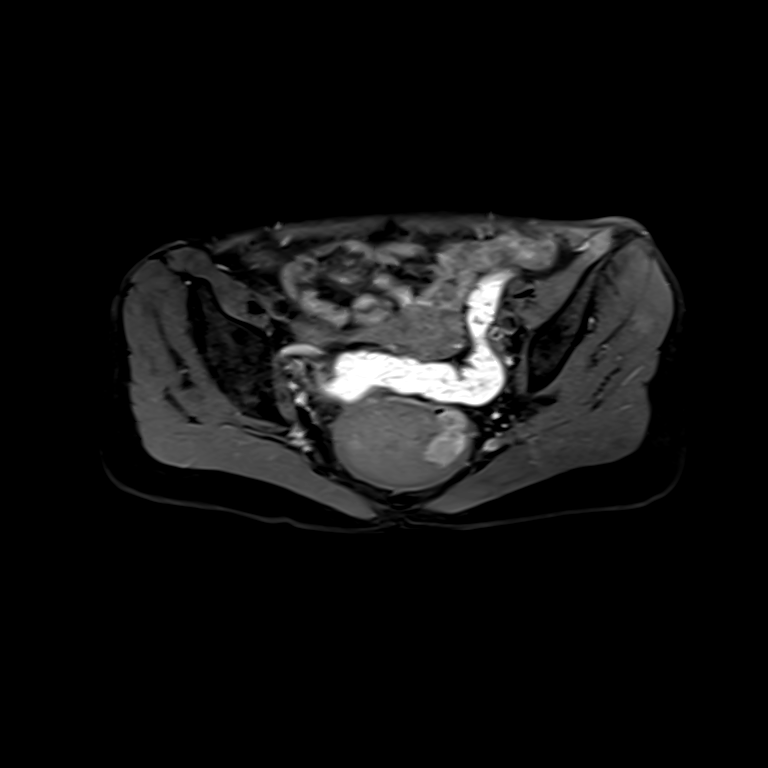
[im 37/37]
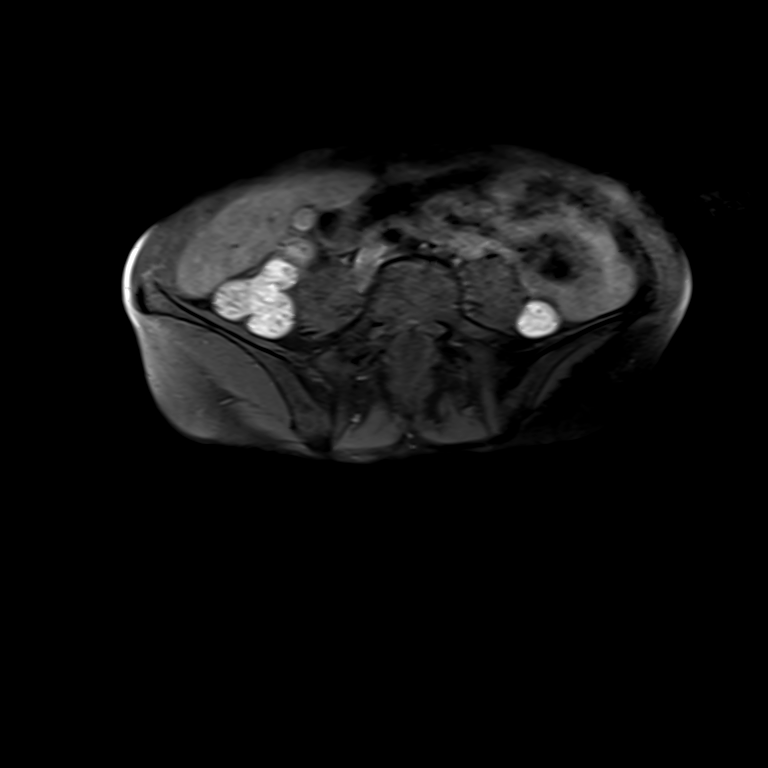

[26 of 48 positions shown; findings below may reference images not displayed]

FINDINGS: TUMOR LOCATION

Tumor distance from Anal Verge/Skin Surface: On the order of
cm, including on [DATE]. Consistent with a high rectal to rectosigmoid
junction lesion.

Tumor distance from Internal Anal Sphincter: 8.8 cm, including on
[DATE].

TUMOR DESCRIPTION

Circumferential Extent: Circumferential, including on [DATE].

Tumor Length: 4.8 cm on [DATE]

T - CATEGORY

Extension through Muscularis Propria: No definite extension through
the muscularis propria. On some images, apparent discontinuity of
the T2 hypointense muscularis propria is felt to be due to
obliquity. Example series 4.

Extramural Vascular Invasion/Tumor Thrombus: No

Invasion of Anterior Peritoneal Reflection: No

Involvement of Adjacent Organs or Pelvic Sidewall: No

Levator Ani Involvement: No

N - CATEGORY

Mesorectal Lymph Nodes >=5mm: None=N0

Extra-mesorectal Lymphadenopathy: No-Nodes within the sigmoid
mesocolon measure up to 5 mm on 45/3 and are indeterminate.

Other: Normal urinary bladder. No bowel obstruction. No significant
free pelvic fluid. Normal uterus for age.
IMPRESSION: Rectal adenocarcinoma T stage: 2

Rectal adenocarcinoma N stage:  0

Distance from tumor to the internal anal sphincter is 8.8 cm.

## 2020-11-17 ENCOUNTER — Encounter: Payer: Self-pay | Admitting: Gastroenterology

## 2020-11-17 ENCOUNTER — Ambulatory Visit (AMBULATORY_SURGERY_CENTER): Payer: Medicaid Other | Admitting: Gastroenterology

## 2020-11-17 ENCOUNTER — Other Ambulatory Visit: Payer: Self-pay

## 2020-11-17 VITALS — BP 122/67 | HR 77 | Temp 97.8°F | Resp 36 | Ht 63.5 in | Wt 103.0 lb

## 2020-11-17 DIAGNOSIS — Z85038 Personal history of other malignant neoplasm of large intestine: Secondary | ICD-10-CM

## 2020-11-17 DIAGNOSIS — D122 Benign neoplasm of ascending colon: Secondary | ICD-10-CM

## 2020-11-17 DIAGNOSIS — K635 Polyp of colon: Secondary | ICD-10-CM

## 2020-11-17 DIAGNOSIS — D124 Benign neoplasm of descending colon: Secondary | ICD-10-CM

## 2020-11-17 DIAGNOSIS — K641 Second degree hemorrhoids: Secondary | ICD-10-CM

## 2020-11-17 MED ORDER — SODIUM CHLORIDE 0.9 % IV SOLN: 500.0000 mL | Freq: Once | INTRAVENOUS | Status: DC

## 2020-11-17 MED ORDER — OMEPRAZOLE 20 MG PO CPDR: 20.0000 mg | DELAYED_RELEASE_CAPSULE | Freq: Two times a day (BID) | ORAL | 3 refills | Status: DC

## 2020-11-17 NOTE — Progress Notes (Signed)
A/ox3, pleased with MAC, report to RN 

## 2020-11-17 NOTE — Progress Notes (Signed)
Vitals-NS  Pt's states no medical or surgical changes since previsit or office visit. 

## 2020-11-17 NOTE — Op Note (Signed)
[place] Patient Name: Sarah [name] Procedure Date: 11/17/2020 7:55 AM MRN: [id number] Endoscopist: Gerrit Heck , MD Age: 49 Referring MD:  Date of Birth: 1971-01-03 Gender: Female Account #: [id number] Procedure:                Colonoscopy Indications:              High risk colon cancer surveillance: Personal                            history of colon cancer                           Colon cancer diagnosed 05/2019, s/p chemoradiation                            then LAR in 10/2019, presents today for ongoing                            surveillance. Medicines:                Monitored Anesthesia Care Procedure:                Pre-Anesthesia Assessment:                           - Prior to the procedure, a History and Physical                            was performed, and patient medications and                            allergies were reviewed. The patient's tolerance of                            previous anesthesia was also reviewed. The risks                            and benefits of the procedure and the sedation                            options and risks were discussed with the patient.                            All questions were answered, and informed consent                            was obtained. Prior Anticoagulants: The patient has                            taken no previous anticoagulant or antiplatelet                            agents. ASA Grade Assessment: II - A patient with  mild systemic disease. After reviewing the risks                            and benefits, the patient was deemed in                            satisfactory condition to undergo the procedure.                           After obtaining informed consent, the colonoscope                            was passed under direct vision. Throughout the                            procedure, the patient's blood pressure, pulse, and                             oxygen saturations were monitored continuously. The                            Olympus PFC-[id number]) Colonoscope was                            introduced through the anus and advanced to the the                            cecum, identified by appendiceal orifice and                            ileocecal valve. The colonoscopy was performed                            without difficulty. The patient tolerated the                            procedure well. The quality of the bowel                            preparation was good. The ileocecal valve,                            appendiceal orifice, and rectum were photographed. Scope In: 8:03:50 AM Scope Out: 8:32:48 AM Scope Withdrawal Time: 0 hours 25 minutes 54 seconds  Total Procedure Duration: 0 hours 28 minutes 58 seconds  Findings:                 Hemorrhoids were found on perianal exam.                           A 4 mm polyp was found in the ascending colon. The                            polyp was sessile. The polyp was removed with a  cold snare. Resection and retrieval were complete.                            Estimated blood loss was minimal.                           A 15 mm polyp was found in the descending colon.                            The polyp was flat. The polyp was removed with a                            saline injection-lift technique using a hot snare.                            Resection and retrieval were complete. Estimated                            blood loss was minimal.                           There was evidence of a prior end-to-end                            colo-colonic anastomosis in the proximal rectum.                            This was patent and was characterized by healthy                            appearing mucosa. The anastomosis was traversed.                           Non-bleeding internal hemorrhoids were found during                            anoscopy. The  hemorrhoids were medium-sized and                            Grade II (internal hemorrhoids that prolapse but                            reduce spontaneously).                           Retroflexion in the rectum was not performed due to                            post-surgical anatomy. Complications:            No immediate complications. Estimated Blood Loss:     Estimated blood loss was minimal. Impression:               - Hemorrhoids found on perianal exam.                           -  One 4 mm polyp in the ascending colon, removed                            with a cold snare. Resected and retrieved.                           - One 15 mm polyp in the descending colon, removed                            using injection-lift and a hot snare. Resected and                            retrieved.                           - Patent end-to-end colo-colonic anastomosis,                            characterized by healthy appearing mucosa.                           - Non-bleeding internal hemorrhoids. Recommendation:           - Patient has a contact number available for                            emergencies. The signs and symptoms of potential                            delayed complications were discussed with the                            patient. Return to normal activities tomorrow.                            Written discharge instructions were provided to the                            patient.                           - Resume previous diet.                           - Continue present medications.                           - Await pathology results.                           - Repeat colonoscopy in 1 year for surveillance.                           - Return to GI office in 6 months, or sooner as  needed. Gerrit Heck, MD 11/17/2020 8:44:03 AM

## 2020-11-17 NOTE — Patient Instructions (Signed)
Handouts given: Hemorrhoids, polyps Resume previous diet  continue present medications Await pathology results Repeat colonoscopy in 1 year Please return to GI office in 6 months or sooner as needed  YOU HAD AN ENDOSCOPIC PROCEDURE TODAY AT [facility]:   Refer to the procedure report that was given to you for any specific questions about what was found during the examination.  If the procedure report does not answer your questions, please call your gastroenterologist to clarify.  If you requested that your care partner not be given the details of your procedure findings, then the procedure report has been included in a sealed envelope for you to review at your convenience later.  YOU SHOULD EXPECT: Some feelings of bloating in the abdomen. Passage of more gas than usual.  Walking can help get rid of the air that was put into your GI tract during the procedure and reduce the bloating. If you had a lower endoscopy (such as a colonoscopy or flexible sigmoidoscopy) you may notice spotting of blood in your stool or on the toilet paper. If you underwent a bowel prep for your procedure, you may not have a normal bowel movement for a few days.  Please Note:  You might notice some irritation and congestion in your nose or some drainage.  This is from the oxygen used during your procedure.  There is no need for concern and it should clear up in a day or so.  SYMPTOMS TO REPORT IMMEDIATELY:   Following lower endoscopy (colonoscopy or flexible sigmoidoscopy):  Excessive amounts of blood in the stool  Significant tenderness or worsening of abdominal pains  Swelling of the abdomen that is new, acute  Fever of 100F or higher  For urgent or emergent issues, a gastroenterologist can be reached at any hour by calling [phone]. Do not use MyChart messaging for urgent concerns.   DIET:  We do recommend a small meal at first, but then you may proceed to your regular diet.  Drink plenty  of fluids but you should avoid alcoholic beverages for 24 hours.  ACTIVITY:  You should plan to take it easy for the rest of today and you should NOT DRIVE or use heavy machinery until tomorrow (because of the sedation medicines used during the test).    FOLLOW UP: Our staff will call the number listed on your records 48-72 hours following your procedure to check on you and address any questions or concerns that you may have regarding the information given to you following your procedure. If we do not reach you, we will leave a message.  We will attempt to reach you two times.  During this call, we will ask if you have developed any symptoms of COVID 19. If you develop any symptoms (ie: fever, flu-like symptoms, shortness of breath, cough etc.) before then, please call [phone].  If you test positive for Covid 19 in the 2 weeks post procedure, please call and report this information to [place].    If any biopsies were taken you will be contacted by phone or by letter within the next 1-3 weeks.  Please call us at [phone] if you have not heard about the biopsies in 3 weeks.   SIGNATURES/CONFIDENTIALITY: You and/or your care partner have signed paperwork which will be entered into your electronic medical record.  These signatures attest to the fact that that the information above on your After Visit Summary has been reviewed and is understood.  Full responsibility of the  confidentiality of this discharge information lies with you and/or your care-partner. 

## 2020-11-21 ENCOUNTER — Telehealth: Payer: Self-pay | Admitting: *Deleted

## 2020-11-21 NOTE — Telephone Encounter (Signed)
No answer for post procedure call back. Left message for patient to call back with questions or concerns.

## 2020-11-23 ENCOUNTER — Telehealth: Payer: Self-pay | Admitting: General Surgery

## 2020-11-23 NOTE — Telephone Encounter (Signed)
-----   Message from [facility] V, DO sent at 11/17/2020  2:50 PM EST ----- Please set up for hemorrhoid banding. Thanks

## 2020-11-23 NOTE — Telephone Encounter (Signed)
Patient scheduled banding appointment 01/02/2021

## 2020-11-23 NOTE — Telephone Encounter (Signed)
Left a message on the patients voicemail to call and schedule an appointment. Dr Barron Alvine would like her to schedule her 1st banding

## 2020-12-01 ENCOUNTER — Encounter: Payer: Self-pay | Admitting: Gastroenterology

## 2020-12-09 ENCOUNTER — Other Ambulatory Visit: Payer: Self-pay

## 2020-12-09 ENCOUNTER — Encounter (HOSPITAL_BASED_OUTPATIENT_CLINIC_OR_DEPARTMENT_OTHER): Payer: Self-pay

## 2020-12-09 ENCOUNTER — Emergency Department (HOSPITAL_BASED_OUTPATIENT_CLINIC_OR_DEPARTMENT_OTHER)
Admission: EM | Admit: 2020-12-09 | Discharge: 2020-12-09 | Disposition: A | Discharge status: Home / Self Care | Payer: Medicaid Other | Attending: Emergency Medicine | Admitting: Emergency Medicine

## 2020-12-09 DIAGNOSIS — Z87891 Personal history of nicotine dependence: Secondary | ICD-10-CM | POA: Diagnosis not present

## 2020-12-09 DIAGNOSIS — M79605 Pain in left leg: Secondary | ICD-10-CM | POA: Diagnosis present

## 2020-12-09 DIAGNOSIS — Z85048 Personal history of other malignant neoplasm of rectum, rectosigmoid junction, and anus: Secondary | ICD-10-CM | POA: Diagnosis not present

## 2020-12-09 LAB — CBC WITH DIFFERENTIAL/PLATELET
Abs Immature Granulocytes: 0.05 10*3/uL (ref 0.00–0.07)
Basophils Absolute: 0.1 10*3/uL (ref 0.0–0.1)
Basophils Relative: 1 %
Eosinophils Absolute: 0.1 10*3/uL (ref 0.0–0.5)
Eosinophils Relative: 1 %
HCT: 42.9 % (ref 36.0–46.0)
Hemoglobin: 14.7 g/dL (ref 12.0–15.0)
Immature Granulocytes: 1 %
Lymphocytes Relative: 10 %
Lymphs Abs: 0.9 10*3/uL (ref 0.7–4.0)
MCH: 32.1 pg (ref 26.0–34.0)
MCHC: 34.3 g/dL (ref 30.0–36.0)
MCV: 93.7 fL (ref 80.0–100.0)
Monocytes Absolute: 0.4 10*3/uL (ref 0.1–1.0)
Monocytes Relative: 5 %
Neutro Abs: 7.5 10*3/uL (ref 1.7–7.7)
Neutrophils Relative %: 82 %
Platelets: 284 10*3/uL (ref 150–400)
RBC: 4.58 MIL/uL (ref 3.87–5.11)
RDW: 12.8 % (ref 11.5–15.5)
WBC: 9 10*3/uL (ref 4.0–10.5)
nRBC: 0 % (ref 0.0–0.2)

## 2020-12-09 LAB — BASIC METABOLIC PANEL
Anion gap: 11 (ref 5–15)
BUN: 15 mg/dL (ref 6–20)
CO2: 24 mmol/L (ref 22–32)
Calcium: 9.2 mg/dL (ref 8.9–10.3)
Chloride: 103 mmol/L (ref 98–111)
Creatinine, Ser: 0.7 mg/dL (ref 0.44–1.00)
GFR, Estimated: >60 mL/min (ref >60)
Glucose, Bld: 105 mg/dL — ABNORMAL HIGH (ref 70–99)
Potassium: 3.9 mmol/L (ref 3.5–5.1)
Sodium: 138 mmol/L (ref 135–145)

## 2020-12-09 LAB — MAGNESIUM: Magnesium: 2.2 mg/dL (ref 1.7–2.4)

## 2020-12-09 MED ORDER — TIZANIDINE HCL 4 MG PO TABS: 2.0000 mg | ORAL_TABLET | Freq: Four times a day (QID) | ORAL | 0 refills | Status: DC | PRN

## 2020-12-09 MED ORDER — DIAZEPAM 2 MG PO TABS
2.0000 mg | ORAL_TABLET | Freq: Once | ORAL | Status: AC
  Administered 2020-12-09: 2 mg via ORAL
  Filled 2020-12-09: qty 1

## 2020-12-09 MED ORDER — KETOROLAC TROMETHAMINE 15 MG/ML IJ SOLN: 7.5000 mg | Freq: Once | INTRAMUSCULAR | Status: AC

## 2020-12-09 MED ORDER — KETOROLAC TROMETHAMINE 15 MG/ML IJ SOLN
INTRAMUSCULAR | Status: AC
  Administered 2020-12-09: 7.5 mg via INTRAVENOUS
  Filled 2020-12-09: qty 1

## 2020-12-09 MED ORDER — SODIUM CHLORIDE 0.9 % IV BOLUS
1000.0000 mL | Freq: Once | INTRAVENOUS | Status: AC
  Administered 2020-12-09: 1000 mL via INTRAVENOUS

## 2020-12-09 NOTE — Discharge Instructions (Addendum)
Call your primary care doctor or specialist as discussed in the next 2-3 days.   Return immediately back to the ER if:  Your symptoms worsen within the next 12-24 hours. You develop new symptoms such as new fevers, persistent vomiting, new pain, shortness of breath, or new weakness or numbness, or if you have any other concerns.  

## 2020-12-09 NOTE — ED Provider Notes (Signed)
[facility] EMERGENCY DEPARTMENT Provider Note   CSN: [id number] Arrival date & time: 12/09/20  5284     History Chief Complaint  Patient presents with  . Leg Pain    Sarah [name] is a 50 y.o. female.  Patient presents ER chief complaint of left calf pain.  Describes sharp spasming pain in the left lateral aspect of her calf intermittently for the last 2 nights.  She states that the sudden pain wakes her up in the middle the night last for several minutes and she has to move her ankle a certain way to relieve the pain.  She has had these kind of cramp several times in the past but they have been more persistent in the last 2 days.  Denies fall or trauma.  Denies fevers cough vomiting or diarrhea denies chest pain or shortness of breath.        Past Medical History:  Diagnosis Date  . Cancer ([place]) 2020   rectal cancer-chemo, surgery  . Depression   . Family history of bladder cancer   . GERD (gastroesophageal reflux disease)     Patient Active Problem List   Diagnosis Date Noted  . Genetic testing 03/01/2020  . Family history of bladder cancer   . 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 Tobacco abuse 07/22/2019  . History of ETOH abuse 07/22/2019  . 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 COLON SURGERY  2020   rectal cancer surgery   . COLONOSCOPY  06/25/2019   Cirigliano   . TUBAL LIGATION  2003     OB History   No obstetric history on file.     Family History  Problem Relation Age of Onset  . Bladder Cancer Maternal Grandfather   . Colon polyps Father   . Colon cancer Neg Hx   . Esophageal cancer Neg Hx   . Rectal cancer Neg Hx   . Stomach cancer Neg Hx     Social History   Tobacco Use  . Smoking status: Former Smoker    Packs/day: 1.00    Types: Cigarettes  . Smokeless tobacco: Never Used  Vaping Use  . Vaping Use: Never used  Substance Use Topics  .  Alcohol use: Not Currently    Alcohol/week: 15.0 standard drinks    Types: 15 Standard drinks or equivalent per week  . Drug use: No    Home Medications Prior to Admission medications   Medication Sig Start Date End Date Taking? Authorizing Provider  tiZANidine (ZANAFLEX) 4 MG tablet Take 0.5 tablets (2 mg total) by mouth every 6 (six) hours as needed for up to 10 doses for muscle spasms. 12/09/20  Yes Luna Fuse, MD  CALCIUM PO Take by mouth.    [provider]  hydrOXYzine (ATARAX/VISTARIL) 25 MG tablet Take 25 mg by mouth daily. 09/17/20   [provider]  naproxen sodium (ALEVE) 220 MG tablet Take 220 mg by mouth.    [provider]  omeprazole (PRILOSEC) 20 MG capsule Take 1 capsule (20 mg total) by mouth 2 (two) times daily before a meal. 11/17/20   Cirigliano, Vito V, DO  PARoxetine (PAXIL) 40 MG tablet Take by mouth.    [provider]    Allergies    Augmentin [amoxicillin-pot clavulanate] and Escitalopram  Review of Systems   Review of Systems  Constitutional: Negative for fever.  HENT: Negative for ear pain.  Eyes: Negative for pain.  Respiratory: Negative for cough.   Cardiovascular: Negative for chest pain.  Gastrointestinal: Negative for abdominal pain.  Genitourinary: Negative for flank pain.  Musculoskeletal: Negative for back pain.  Skin: Negative for rash.  Neurological: Negative for headaches.    Physical Exam Updated Vital Signs BP (!) 112/94   Pulse 93   Temp 98.1 F (36.7 C) (Oral)   Resp 16   Ht 5\' 3"  (1.6 m)   Wt 46.3 kg   SpO2 98%   BMI 18.07 kg/m   Physical Exam Constitutional:      General: She is not in acute distress.    Appearance: Normal appearance.  HENT:     Head: Normocephalic.     Nose: Nose normal.  Eyes:     Extraocular Movements: Extraocular movements intact.  Cardiovascular:     Rate and Rhythm: Normal rate.  Pulmonary:     Effort: Pulmonary effort is normal.  Musculoskeletal:         General: No swelling. Normal range of motion.     Cervical back: Normal range of motion.     Comments: Mild left calf lateral aspect tenderness palpation.  No calf swelling noted bilaterally.  Otherwise patient ranging her hips knees and ankles well without pain or discomfort.  No right lower extremity tenderness or pain or discomfort on range of motion.  Neurological:     General: No focal deficit present.     Mental Status: She is alert. Mental status is at baseline.     ED Results / Procedures / Treatments   Labs (all labs ordered are listed, but only abnormal results are displayed) Labs Reviewed  BASIC METABOLIC PANEL - Abnormal; Notable for the following components:      Result Value   Glucose, Bld 105 (*)    All other components within normal limits  CBC WITH DIFFERENTIAL/PLATELET  MAGNESIUM    EKG None  Radiology No results found.  Procedures Procedures (including critical care time)  Medications Ordered in ED Medications  sodium chloride 0.9 % bolus 1,000 mL (0 mLs Intravenous Stopped 12/09/20 1055)  diazepam (VALIUM) tablet 2 mg (2 mg Oral Given 12/09/20 0942)  ketorolac (TORADOL) 15 MG/ML injection 7.5 mg (7.5 mg Intravenous Given 12/09/20 1027)    ED Course  I have reviewed the triage vital signs and the nursing notes.  Pertinent labs & imaging results that were available during my care of the patient were reviewed by me and considered in my medical decision making (see chart for details).    MDM Rules/Calculators/A&P                          Patient states that she has mild soreness in the left lateral calf ER she had sudden recurrence of left calf spasming pain.  This is relieved when she dorsiflexes her ankle, however when she relaxes her ankle or plantar flexes it to spasming pain in her left calf returns.  Symptoms have improved with medications and IV fluids and dorsiflexion of her ankle.  I have low suspicion of DVT as she has no calf swelling,  tenderness isolated to the lateral aspect of the left calf and this is very positional more consistent with leg cramps.  Will advise rest at home, patient follow-up with her doctors in 2 or 3 days.  Advised me to return for worsening pain chest pain shortness of breath or any additional concerns.   Final Clinical  Impression(s) / ED Diagnoses Final diagnoses:  Left leg pain    Rx / DC Orders ED Discharge Orders         Ordered    tiZANidine (ZANAFLEX) 4 MG tablet  Every 6 hours PRN        12/09/20 1408           Luna Fuse, MD 12/12/20 1700

## 2020-12-09 NOTE — ED Triage Notes (Signed)
Pt reports really bad leg cramps the past two nights. Pt c/o R lower leg pain that goes up into her hip. No redness or swelling or warmth noted in leg. L lower leg noted to be tender upon palpation. Pt denies injury or trauma.

## 2020-12-09 NOTE — ED Notes (Addendum)
Patient reports cramping to left leg, relieved with dorsiflexion by Dr. Almyra Free at the bedside.

## 2021-01-02 ENCOUNTER — Encounter: Payer: Self-pay | Admitting: Gastroenterology

## 2021-01-02 ENCOUNTER — Ambulatory Visit (INDEPENDENT_AMBULATORY_CARE_PROVIDER_SITE_OTHER): Payer: Medicaid Other | Admitting: Gastroenterology

## 2021-01-02 VITALS — BP 104/72 | HR 90 | Ht 64.0 in | Wt 104.3 lb

## 2021-01-02 DIAGNOSIS — K641 Second degree hemorrhoids: Secondary | ICD-10-CM

## 2021-01-02 DIAGNOSIS — Z85038 Personal history of other malignant neoplasm of large intestine: Secondary | ICD-10-CM

## 2021-01-02 DIAGNOSIS — Z86010 Personal history of colonic polyps: Secondary | ICD-10-CM

## 2021-01-02 NOTE — Patient Instructions (Signed)
If you are age 52 or older, your body mass index should be between 23-30. Your Body mass index is 17.91 kg/m. If this is out of the aforementioned range listed, please consider follow up with your Primary Care Provider.  If you are age 51 or younger, your body mass index should be between 19-25. Your Body mass index is 17.91 kg/m. If this is out of the aformentioned range listed, please consider follow up with your Primary Care Provider.   Due to recent changes in healthcare laws, you may see the results of your imaging and laboratory studies on MyChart before your provider has had a chance to review them.  We understand that in some cases there may be results that are confusing or concerning to you. Not all laboratory results come back in the same time frame and the provider may be waiting for multiple results in order to interpret others.  Please give [place] 48 hours in order for your provider to thoroughly review all the results before contacting the office for clarification of your results.   HEMORRHOID BANDING PROCEDURE    FOLLOW-UP CARE   1. The procedure you have had should have been relatively painless since the banding of the area involved does not have nerve endings and there is no pain sensation.  The rubber band cuts off the blood supply to the hemorrhoid and the band may fall off as soon as 48 hours after the banding (the band may occasionally be seen in the toilet bowl following a bowel movement). You may notice a temporary feeling of fullness in the rectum which should respond adequately to plain Tylenol or Motrin.  2. Following the banding, avoid strenuous exercise that evening and resume full activity the next day.  A sitz bath (soaking in a warm tub) or bidet is soothing, and can be useful for cleansing the area after bowel movements.     3. To avoid constipation, take two tablespoons of natural wheat bran, natural oat bran, flax, Benefiber or any over the counter fiber supplement  and increase your water intake to 7-8 glasses daily.    4. Unless you have been prescribed anorectal medication, do not put anything inside your rectum for two weeks: No suppositories, enemas, fingers, etc.  5. Occasionally, you may have more bleeding than usual after the banding procedure.  This is often from the untreated hemorrhoids rather than the treated one.  Don't be concerned if there is a tablespoon or so of blood.  If there is more blood than this, lie flat with your bottom higher than your head and apply an ice pack to the area. If the bleeding does not stop within a half an hour or if you feel faint, call our office at [phone] or go to the emergency room.  6. Problems are not common; however, if there is a substantial amount of bleeding, severe pain, chills, fever or difficulty passing urine (very rare) or other problems, you should call us at [phone] or report to the nearest emergency room.  7. Do not stay seated continuously for more than 2-3 hours for a day or two after the procedure.  Tighten your buttock muscles 10-15 times every two hours and take 10-15 deep breaths every 1-2 hours.  Do not spend more than a few minutes on the toilet if you cannot empty your bowel; instead re-visit the toilet at a later time.    3-14 at 2:20pm for second hemorrhoid banding

## 2021-01-02 NOTE — Progress Notes (Signed)
P  Chief Complaint:    Symptomatic Internal Hemorrhoids; Hemorrhoid Band Ligation  GI History: 50 year old female with history of stage I rectosigmoid cancer (T2N0) diagnosed at the time of index colonoscopy in 05/2019, s/p chemoradiation then LAR 11/24/2019.  Most recent imaging is CT chest from 11/2020 without thoracic metastatic disease.    Endoscopic History: - Colonoscopy (05/2019, Dr. Bryan Lemma): 6 cm rectosigmoid Adenocarcinoma with oozing, tattoo placed proximal and distal, internal hemorrhoids.  Distal transverse colon was extent reached due to needing smaller caliber endoscope to traverse mass. -EGD (05/2019, Dr. Bryan Lemma): LA Grade A esophagitis, esophageal diverticulum, H. pylori gastritis, normal duodenum.  Treated with quadruple therapy -Colonsocopy (10/2020): 4 mm ascending polyp (TA), 15 mm descending flat polyp (HP), healthy-appearing anastomosis and proximal rectum.  Grade 2 hemorrhoids.  Repeat 1 year.  HPI:     Patient is a 50 y.o. femalewith a history of symptomatic internal hemorrhoids presenting to the Gastroenterology Clinic for follow-up and ongoing treatment. The patient presents with symptomatic grade 2 hemorrhoids, unresponsive to maximal medical therapy, requesting rubber band ligation of symptomatic hemorrhoidal disease.  For history of rectal cancer, was recently seen in the Surgical Oncology clinic on 12/05/2020.  No change in medical or surgical history, medications, allergies, social history since last appointment with me.   Review of systems:     No chest pain, no SOB, no fevers, no urinary sx   Past Medical History:  Diagnosis Date  . Cancer ([place]) 2020   rectal cancer-chemo, surgery  . Depression   . Family history of bladder cancer   . GERD (gastroesophageal reflux disease)     Patient's surgical history, family medical history, social history, medications and allergies were all reviewed in Epic    Current Outpatient Medications   Medication Sig Dispense Refill  . CALCIUM PO Take by mouth.    . hydrOXYzine (ATARAX/VISTARIL) 25 MG tablet Take 25 mg by mouth daily.    . naproxen sodium (ALEVE) 220 MG tablet Take 220 mg by mouth.    Marland Kitchen omeprazole (PRILOSEC) 20 MG capsule Take 1 capsule (20 mg total) by mouth 2 (two) times daily before a meal. 90 capsule 3   No current facility-administered medications for this visit.    Physical Exam:     BP 104/72 (BP Location: Right Arm, Patient Position: Sitting, Cuff Size: Normal)   Pulse 90   Ht 5\' 4"  (1.626 m)   Wt 104 lb 5 oz (47.3 kg)   BMI 17.91 kg/m   GENERAL:  Pleasant female in NAD PSYCH: : Cooperative, normal affect EENT:  conjunctiva pink, mucous membranes moist, neck supple without masses Rectal exam: Sensation intact and preserved anal wink.  Grade 2 hemorrhoids noted in all positions on anoscopy.  External skin tags noted.  No external anal fissures noted. Normal sphincter tone. No palpable mass. No blood on the exam glove. (Chaperone: Lanny Hurst, CMA).   IMPRESSION and PLAN:    #1.  Symptomatic internal hemorrhoids: PROCEDURE NOTE: The patient presents with symptomatic grade 2 hemorrhoids, unresponsive to maximal medical therapy, requesting rubber band ligation of symptomatic hemorrhoidal disease.  All risks, benefits and alternative forms of therapy were described and informed consent was obtained.  In the Left Lateral Decubitus position, anoscopic examination revealed grade 2 hemorrhoids in the all position(s).  The anorectum was pre-medicated with RectiCare. The decision was made to band the LL internal hemorrhoid, and the [place] was used to perform band ligation without complication.  Digital anorectal examination  was then performed to assure proper positioning of the band, and to adjust the banded tissue as required.  The patient was discharged home without pain or other issues.  Dietary and behavioral recommendations were given and along  with follow-up instructions.     The following adjunctive treatments were recommended:  -Resume high-fiber diet with fiber supplement (i.e. Citrucel or Benefiber) with goal for soft stools without straining to have a BM. -Resume adequate fluid intake.  The patient will return in  for  follow-up and possible additional banding as required. No complications were encountered and the patient tolerated the procedure well.      #2.  History of rectal cancer #3.  History of colon polyps - Repeat colonoscopy in 1 year (10/2021) for ongoing short interval surgeillance -Continue follow-up with [facility] as planned      Lavena Bullion ,DO, FACG 01/02/2021, 10:13 AM

## 2021-02-06 ENCOUNTER — Ambulatory Visit (INDEPENDENT_AMBULATORY_CARE_PROVIDER_SITE_OTHER): Payer: Medicaid Other | Admitting: Gastroenterology

## 2021-02-06 ENCOUNTER — Encounter: Payer: Self-pay | Admitting: Gastroenterology

## 2021-02-06 DIAGNOSIS — K641 Second degree hemorrhoids: Secondary | ICD-10-CM

## 2021-02-06 NOTE — Progress Notes (Signed)
P  Chief Complaint:    Symptomatic Internal Hemorrhoids; Hemorrhoid Band Ligation  GI History: 50 year old female with history of stage I rectosigmoid cancer (T2N0) diagnosed at the time of index colonoscopy in 05/2019, s/p chemoradiation then LAR 11/24/2019.  Most recent imaging is CT chest from 11/2020 without thoracic metastatic disease.  -01/02/2021: Successful banding of LL hemorrhoid -02/06/2021: Hemorrhoid banding #2   Endoscopic History: -Colonoscopy (05/2019, Dr. Jakaiya Netherland):6 cm rectosigmoid Adenocarcinoma with oozing, tattoo placed proximal and distal, internal hemorrhoids. Distal transverse colon was extent reached due to needing smaller caliber endoscope to traverse mass. -EGD (05/2019, Dr. Bryan Lemma): LA Grade A esophagitis, esophageal diverticulum, H. pylori gastritis, normal duodenum. Treated with quadruple therapy -Colonsocopy (10/2020): 4 mm ascending polyp (TA), 15 mm descending flat polyp (HP), healthy-appearing anastomosis and proximal rectum.  Grade 2 hemorrhoids.  Repeat 1 year.  HPI:     Patient is a 50 y.o. femalewith a history of symptomatic internal hemorrhoids presenting to the Gastroenterology Clinic for follow-up and ongoing treatment. The patient presents with symptomatic grade 2 hemorrhoids, unresponsive to maximal medical therapy, requesting rubber band ligation of symptomatic hemorrhoidal disease.  For history of rectal cancer, was recently seen in the Surgical Oncology clinic on 12/05/2020.  No change in medical or surgical history, medications, allergies, social history since last appointment with me.   Review of systems:     No chest pain, no SOB, no fevers, no urinary sx   Past Medical History:  Diagnosis Date  . Cancer ([place]) 2020   rectal cancer-chemo, surgery  . Depression   . Family history of bladder cancer   . GERD (gastroesophageal reflux disease)     Patient's surgical history, family medical history, social history, medications  and allergies were all reviewed in Epic    Current Outpatient Medications  Medication Sig Dispense Refill  . CALCIUM PO Take by mouth.    . hydrOXYzine (ATARAX/VISTARIL) 25 MG tablet Take 25 mg by mouth daily.    . naproxen sodium (ALEVE) 220 MG tablet Take 220 mg by mouth.    Marland Kitchen omeprazole (PRILOSEC) 20 MG capsule Take 1 capsule (20 mg total) by mouth 2 (two) times daily before a meal. 90 capsule 3   No current facility-administered medications for this visit.    Physical Exam:     BP (!) 112/58   Pulse 62   Ht 5\' 4"  (1.626 m)   Wt 107 lb (48.5 kg)   BMI 18.37 kg/m   GENERAL:  Pleasant female in NAD PSYCH: : Cooperative, normal affect NEURO: Alert and oriented x 3, no focal neurologic deficits Rectal exam: Sensation intact and preserved anal wink.   External skin tags present.  Grade 2 hemorrhoids noted in RP/RA positions. No external anal fissures noted. Normal sphincter tone. No palpable mass. No blood on the exam glove. (Chaperone: Othelia Pulling, CMA).   IMPRESSION and PLAN:    #1.  Symptomatic internal hemorrhoids: PROCEDURE NOTE: The patient presents with symptomatic grade 2 hemorrhoids, unresponsive to maximal medical therapy, requesting rubber band ligation of symptomatic hemorrhoidal disease.  All risks, benefits and alternative forms of therapy were described and informed consent was obtained.  In the Left Lateral Decubitus position, anoscopic examination revealed grade 2 hemorrhoids in the RP/RA position(s).  The anorectum was pre-medicated with RectiCare. The decision was made to band the RP internal hemorrhoid, and the [place] was used to perform band ligation without complication.  Digital anorectal examination was then performed to assure proper positioning of  the band, and to adjust the banded tissue as required.  The patient was discharged home without pain or other issues.  Dietary and behavioral recommendations were given and along with follow-up  instructions.     The following adjunctive treatments were recommended:  -Resume high-fiber diet with fiber supplement (i.e. Citrucel or Benefiber) with goal for soft stools without straining to have a BM. -Resume adequate fluid intake.  The patient will return in 2-4 for  follow-up and possible additional banding as required. No complications were encountered and the patient tolerated the procedure well.      #2.  History of rectal cancer #3.  History of colon polyps - Repeat colonoscopy in 1 year (10/2021) for ongoing short interval surgeillance -Continue follow-up with [facility] as planned      Lavena Bullion ,DO, FACG 02/06/2021, 3:02 PM

## 2021-02-06 NOTE — Patient Instructions (Signed)

## 2021-03-16 ENCOUNTER — Ambulatory Visit: Payer: Medicaid Other | Admitting: Gastroenterology

## 2021-04-19 ENCOUNTER — Ambulatory Visit (INDEPENDENT_AMBULATORY_CARE_PROVIDER_SITE_OTHER): Payer: Medicaid Other | Admitting: Gastroenterology

## 2021-04-19 ENCOUNTER — Other Ambulatory Visit: Payer: Self-pay

## 2021-04-19 ENCOUNTER — Encounter: Payer: Self-pay | Admitting: Gastroenterology

## 2021-04-19 VITALS — BP 126/74 | HR 107 | Ht 63.0 in | Wt 100.1 lb

## 2021-04-19 DIAGNOSIS — K641 Second degree hemorrhoids: Secondary | ICD-10-CM

## 2021-04-19 NOTE — Patient Instructions (Signed)
If you are age 52 or older, your body mass index should be between 23-30. Your Body mass index is 17.74 kg/m. If this is out of the aforementioned range listed, please consider follow up with your Primary Care Provider.  If you are age 1 or younger, your body mass index should be between 19-25. Your Body mass index is 17.74 kg/m. If this is out of the aformentioned range listed, please consider follow up with your Primary Care Provider.   HEMORRHOID BANDING PROCEDURE    FOLLOW-UP CARE   1. The procedure you have had should have been relatively painless since the banding of the area involved does not have nerve endings and there is no pain sensation.  The rubber band cuts off the blood supply to the hemorrhoid and the band may fall off as soon as 48 hours after the banding (the band may occasionally be seen in the toilet bowl following a bowel movement). You may notice a temporary feeling of fullness in the rectum which should respond adequately to plain Tylenol or Motrin.  2. Following the banding, avoid strenuous exercise that evening and resume full activity the next day.  A sitz bath (soaking in a warm tub) or bidet is soothing, and can be useful for cleansing the area after bowel movements.     3. To avoid constipation, take two tablespoons of natural wheat bran, natural oat bran, flax, Benefiber or any over the counter fiber supplement and increase your water intake to 7-8 glasses daily.    4. Unless you have been prescribed anorectal medication, do not put anything inside your rectum for two weeks: No suppositories, enemas, fingers, etc.  5. Occasionally, you may have more bleeding than usual after the banding procedure.  This is often from the untreated hemorrhoids rather than the treated one.  Don't be concerned if there is a tablespoon or so of blood.  If there is more blood than this, lie flat with your bottom higher than your head and apply an ice pack to the area. If the bleeding  does not stop within a half an hour or if you feel faint, call our office at [phone] or go to the emergency room.  6. Problems are not common; however, if there is a substantial amount of bleeding, severe pain, chills, fever or difficulty passing urine (very rare) or other problems, you should call us at [phone] or report to the nearest emergency room.  7. Do not stay seated continuously for more than 2-3 hours for a day or two after the procedure.  Tighten your buttock muscles 10-15 times every two hours and take 10-15 deep breaths every 1-2 hours.  Do not spend more than a few minutes on the toilet if you cannot empty your bowel; instead re-visit the toilet at a later time.    Due to recent changes in healthcare laws, you may see the results of your imaging and laboratory studies on MyChart before your provider has had a chance to review them.  We understand that in some cases there may be results that are confusing or concerning to you. Not all laboratory results come back in the same time frame and the provider may be waiting for multiple results in order to interpret others.  Please give [place] 48 hours in order for your provider to thoroughly review all the results before contacting the office for clarification of your results.   Thank you for choosing me and [facility].  [employer]  Cirigliano, D.O.

## 2021-04-19 NOTE — Progress Notes (Signed)
P  Chief Complaint:    Symptomatic Internal [name]; Hemorrhoid Band Ligation  GI History: 50 year old female with history ofstage Irectosigmoid cancer(T2N0)diagnosed at the time of index colonoscopy in 05/2019, s/pchemoradiation then LAR 11/24/2019. Most recent imaging is CT chest from 11/2020 without thoracic metastatic disease.  -01/02/2021: Successful banding of LL hemorrhoid -02/06/2021: Successful banding of RP hemorrhoid -04/19/2021: Hemorrhoid banding #3   Endoscopic History: -Colonoscopy (05/2019, Dr. Rodney Yera):6 cm rectosigmoid Adenocarcinoma with oozing, tattoo placed proximal and distal, internal [name]. Distal transverse colon was extent reached due to needing smaller caliber endoscope to traverse mass. -EGD (05/2019, Dr. Bryan Lemma): LA Grade A esophagitis, esophageal diverticulum, H. pylori gastritis, normal duodenum. Treated with quadruple therapy -Colonsocopy (10/2020):4 mm ascending polyp (TA), 15 mm descending flat polyp (HP), healthy-appearing anastomosis and proximal rectum. Grade 2 [name]. Repeat 1 year.  HPI:     Patient is a Sarah [name] presenting to the Gastroenterology Clinic for follow-up and ongoing treatment. The patient presents with symptomatic grade 2 [name], unresponsive to maximal medical therapy, requesting rubber band ligation of symptomatic hemorrhoidal disease.  Has tolerated each of the first 2 banding's without issue.  Hemorrhoidal symptoms much improved since starting banding series.  For history of colon cancer, continues to follow with [facility] and Surgical Oncology, last seen by medical oncology on 03/17/2019.  Was recently seen by her GYN and has further evaluation scheduled next week for abnormal Pap.    Review of systems:     No chest pain, no SOB, no fevers, no urinary sx   Past Medical History:  Diagnosis Date  . Cancer ([place]) 2020    rectal cancer-chemo, surgery  . Depression   . Family history of bladder cancer   . GERD (gastroesophageal reflux disease)     Patient's surgical history, family medical history, social history, medications and allergies were all reviewed in Epic    Current Outpatient Medications  Medication Sig Dispense Refill  . CALCIUM PO Take by mouth.    . naproxen sodium (ALEVE) 220 MG tablet Take 220 mg by mouth.    Marland Kitchen omeprazole (PRILOSEC) 20 MG capsule Take 1 capsule (20 mg total) by mouth 2 (two) times daily before a meal. (Patient taking differently: Take 20 mg by mouth as needed.) 90 capsule 3  . hydrOXYzine (ATARAX/VISTARIL) 25 MG tablet Take 25 mg by mouth daily. (Patient not taking: Reported on 04/19/2021)     No current facility-administered medications for this visit.    Physical Exam:     BP 126/74   Pulse (!) 107   Ht 5\' 3"  (1.6 m)   Wt 100 lb 2 oz (45.4 kg)   BMI 17.74 kg/m   GENERAL:  Pleasant female in NAD PSYCH: : Cooperative, normal affect NEURO: Alert and oriented x 3, no focal neurologic deficits Rectal exam: Sensation intact and preserved anal wink.   External skin tags.  Grade 2 [name] noted in RA position.  No external anal fissures noted. Normal sphincter tone. No palpable mass. No blood on the exam glove. (Chaperone: Ms. Elgie Congo, CMA).   IMPRESSION and PLAN:    #1.  Symptomatic internal [name]: PROCEDURE NOTE: The patient presents with symptomatic grade 2 [name], unresponsive to maximal medical therapy, requesting rubber band ligation of symptomatic hemorrhoidal disease.  All risks, benefits and alternative forms of therapy were described and informed consent was obtained.  In the Left Lateral Decubitus position, anoscopic examination revealed grade 2 [name] in the RA position(s).  The anorectum was pre-medicated with RectiCare. The decision was made to band the RA internal hemorrhoid, and the [place] was used to perform band ligation  without complication.  Digital anorectal examination was then performed to assure proper positioning of the band, and to adjust the banded tissue as required.  The patient was discharged home without pain or other issues.  Dietary and behavioral recommendations were given and along with follow-up instructions.     The following adjunctive treatments were recommended:  -Resume high-fiber diet with fiber supplement (i.e. Citrucel or Benefiber) with goal for soft stools without straining to have a BM. -Resume adequate fluid intake.  The patient will return as needed for follow-up and possible additional banding if return of index hemorrhoidal symptoms. No complications were encountered and the patient tolerated the procedure well.   #2.  History of rectal cancer #3.History of colon polyps - Repeat colonoscopy in 1 year (10/2021) for ongoing short interval surgeillance -Continue follow-up with [facility] and Surgical Oncology as planned  #4.  Abnormal Pap - Follow-up with [place] ,DO, FACG 04/19/2021, 3:06 PM

## 2021-10-26 ENCOUNTER — Encounter: Payer: Self-pay | Admitting: Gastroenterology

## 2021-11-15 ENCOUNTER — Other Ambulatory Visit: Payer: Self-pay

## 2021-11-15 ENCOUNTER — Ambulatory Visit (AMBULATORY_SURGERY_CENTER): Payer: Medicaid Other

## 2021-11-15 VITALS — Ht 64.0 in | Wt 100.0 lb

## 2021-11-15 DIAGNOSIS — Z85038 Personal history of other malignant neoplasm of large intestine: Secondary | ICD-10-CM

## 2021-11-15 DIAGNOSIS — Z86010 Personal history of colonic polyps: Secondary | ICD-10-CM

## 2021-11-15 MED ORDER — NA SULFATE-K SULFATE-MG SULF 17.5-3.13-1.6 GM/177ML PO SOLN: 1.0000 | Freq: Once | ORAL | 0 refills | Status: AC

## 2021-11-15 NOTE — Progress Notes (Signed)
Pre visit completed via phone call; Patient verified name, DOB, and address; No egg or soy allergy known to patient  No issues known to pt with past sedation with any surgeries or procedures Patient denies ever being told they had issues or difficulty with intubation  No FH of Malignant Hyperthermia Pt is not on diet pills Pt is not on home 02  Pt is not on blood thinners  Pt denies issues with constipation - "sometimes"- patient advised to increase po fluids/activities, stool softener as needed/Miralax if needed No A fib or A flutter Pt is fully vaccinated for Covid x 2; NO PA's for preps discussed with pt in PV today  Discussed with pt there will be an out-of-pocket cost for prep and that varies from $0 to 70 + dollars - pt verbalized understanding  Due to the COVID-19 pandemic we are asking patients to follow certain guidelines in PV and the [place]   Pt aware of COVID protocols and [facility] guidelines

## 2021-11-29 ENCOUNTER — Encounter: Payer: Self-pay | Admitting: Certified Registered Nurse Anesthetist

## 2021-11-30 ENCOUNTER — Encounter: Payer: Self-pay | Admitting: Gastroenterology

## 2021-11-30 ENCOUNTER — Ambulatory Visit (AMBULATORY_SURGERY_CENTER): Payer: Medicaid Other | Admitting: Gastroenterology

## 2021-11-30 ENCOUNTER — Other Ambulatory Visit: Payer: Self-pay

## 2021-11-30 VITALS — BP 123/76 | HR 79 | Temp 98.6°F | Resp 16 | Ht 64.0 in | Wt 100.0 lb

## 2021-11-30 DIAGNOSIS — K635 Polyp of colon: Secondary | ICD-10-CM | POA: Diagnosis not present

## 2021-11-30 DIAGNOSIS — Z86010 Personal history of colonic polyps: Secondary | ICD-10-CM | POA: Diagnosis not present

## 2021-11-30 DIAGNOSIS — K640 First degree hemorrhoids: Secondary | ICD-10-CM

## 2021-11-30 DIAGNOSIS — Z85038 Personal history of other malignant neoplasm of large intestine: Secondary | ICD-10-CM | POA: Diagnosis not present

## 2021-11-30 DIAGNOSIS — D125 Benign neoplasm of sigmoid colon: Secondary | ICD-10-CM

## 2021-11-30 MED ORDER — SODIUM CHLORIDE 0.9 % IV SOLN: 500.0000 mL | Freq: Once | INTRAVENOUS | Status: DC

## 2021-11-30 NOTE — Patient Instructions (Signed)
Thank you for allowing [place] to care for you today! Await pathology results, approximately 2 weeks.   Will make recommendation at that time for future colonoscopy. Resume your diet and medications today, return to normal daily activities tomorrow.  YOU HAD AN ENDOSCOPIC PROCEDURE TODAY AT [facility]:   Refer to the procedure report that was given to you for any specific questions about what was found during the examination.  If the procedure report does not answer your questions, please call your gastroenterologist to clarify.  If you requested that your care partner not be given the details of your procedure findings, then the procedure report has been included in a sealed envelope for you to review at your convenience later.  YOU SHOULD EXPECT: Some feelings of bloating in the abdomen. Passage of more gas than usual.  Walking can help get rid of the air that was put into your GI tract during the procedure and reduce the bloating. If you had a lower endoscopy (such as a colonoscopy or flexible sigmoidoscopy) you may notice spotting of blood in your stool or on the toilet paper. If you underwent a bowel prep for your procedure, you may not have a normal bowel movement for a few days.  Please Note:  You might notice some irritation and congestion in your nose or some drainage.  This is from the oxygen used during your procedure.  There is no need for concern and it should clear up in a day or so.  SYMPTOMS TO REPORT IMMEDIATELY:  Following lower endoscopy (colonoscopy or flexible sigmoidoscopy):  Excessive amounts of blood in the stool  Significant tenderness or worsening of abdominal pains  Swelling of the abdomen that is new, acute  Fever of 100F or higher   For urgent or emergent issues, a gastroenterologist can be reached at any hour by calling [phone]. Do not use MyChart messaging for urgent concerns.    DIET:  We do recommend a small meal at first, but then you may  proceed to your regular diet.  Drink plenty of fluids but you should avoid alcoholic beverages for 24 hours.  ACTIVITY:  You should plan to take it easy for the rest of today and you should NOT DRIVE or use heavy machinery until tomorrow (because of the sedation medicines used during the test).    FOLLOW UP: Our staff will call the number listed on your records 48-72 hours following your procedure to check on you and address any questions or concerns that you may have regarding the information given to you following your procedure. If we do not reach you, we will leave a message.  We will attempt to reach you two times.  During this call, we will ask if you have developed any symptoms of COVID 19. If you develop any symptoms (ie: fever, flu-like symptoms, shortness of breath, cough etc.) before then, please call [phone].  If you test positive for Covid 19 in the 2 weeks post procedure, please call and report this information to [place].    If any biopsies were taken you will be contacted by phone or by letter within the next 1-3 weeks.  Please call us at [phone] if you have not heard about the biopsies in 3 weeks.    SIGNATURES/CONFIDENTIALITY: You and/or your care partner have signed paperwork which will be entered into your electronic medical record.  These signatures attest to the fact that that the information above on your After Visit Summary has  been reviewed and is understood.  Full responsibility of the confidentiality of this discharge information lies with you and/or your care-partner.

## 2021-11-30 NOTE — Progress Notes (Signed)
VS by DT  Pt's states no medical or surgical changes since previsit or office visit.  

## 2021-11-30 NOTE — Op Note (Signed)
Sarah [name] Procedure Date: 11/30/2021 11:36 AM MRN: [id number] Endoscopist: Gerrit Heck , MD Age: 51 Referring MD:  Date of Birth: 08-Mar-1971 Gender: Female Account #: [id number] Procedure:                Colonoscopy Indications:              High risk colon cancer surveillance: Personal                            history of colon cancer adn adenomatous polyps                           History ofstage Irectosigmoid                            cancer(T2N0)diagnosed at the time of index                            colonoscopy in 05/2019, s/pchemoradiation then LAR                            11/24/2019.                           -Colonoscopy (05/2019, Dr. Shantana Christon):6 cm                            rectosigmoid Adenocarcinoma with oozing, tattoo                            placed proximal and distal, internal hemorrhoids.                            Distal transverse colon was extent reached due to                            needing smaller caliber endoscope to traverse mass.                           -Colonsocopy (10/2020):4 mm ascending polyp (TA),                            15 mm descending flat polyp (HP), healthy-appearing                            anastomosis and proximal rectum. Grade 2                            hemorrhoids. Repeat 1 year. Medicines:                Monitored Anesthesia Care Procedure:                Pre-Anesthesia Assessment:                           - Prior to the procedure, a History and Physical  was performed, and patient medications and                            allergies were reviewed. The patient's tolerance of                            previous anesthesia was also reviewed. The risks                            and benefits of the procedure and the sedation                            options and risks were discussed with the patient.                            All questions  were answered, and informed consent                            was obtained. Prior Anticoagulants: The patient has                            taken no previous anticoagulant or antiplatelet                            agents. ASA Grade Assessment: II - A patient with                            mild systemic disease. After reviewing the risks                            and benefits, the patient was deemed in                            satisfactory condition to undergo the procedure.                           After obtaining informed consent, the colonoscope                            was passed under direct vision. Throughout the                            procedure, the patient's blood pressure, pulse, and                            oxygen saturations were monitored continuously. The                            Olympus PCF-H190DL (#[id number]) Colonoscope was                            introduced through the anus and advanced to the the  terminal ileum. The colonoscopy was performed                            without difficulty. The patient tolerated the                            procedure well. The quality of the bowel                            preparation was good. The terminal ileum, ileocecal                            valve, appendiceal orifice, and rectum were                            photographed. Scope In: 11:53:47 AM Scope Out: 12:12:34 PM Scope Withdrawal Time: 0 hours 15 minutes 49 seconds  Total Procedure Duration: 0 hours 18 minutes 47 seconds  Findings:                 Skin tags were found on perianal exam.                           Two sessile polyps were found in the distal sigmoid                            colon, located 4-5 cm proximal to the anastamosis.                            The polyps were 1 to 2 mm in size. These polyps                            were removed with a cold biopsy forceps. Resection                            and retrieval  were complete. Estimated blood loss                            was minimal.                           A 5 mm polyp was found in the distal sigmoid colon,                            located 2 cm proximal to the anastamosis. The polyp                            was sessile. The polyp was removed with a cold                            snare. Resection and retrieval were complete.                            Estimated blood loss was  minimal.                           There was evidence of a prior end-to-end                            colo-colonic anastomosis in the proximal rectum.                            This was patent and was characterized by healthy                            appearing mucosa. The anastomosis was traversed.                           Internal hemorrhoids were found during                            retroflexion. The hemorrhoids were small and                            improved compared with previous study. There were                            intact scars from prior hemorrhoid banding that                            were healthy appearing.                           Retroflexion in the right colon was performed and                            normal appearing.                           The terminal ileum appeared normal. Complications:            No immediate complications. Estimated Blood Loss:     Estimated blood loss was minimal. Impression:               - Perianal skin tags found on perianal exam.                           - Two 1 to 2 mm polyps in the distal sigmoid colon,                            removed with a cold biopsy forceps. Resected and                            retrieved.                           - One 5 mm polyp in the distal sigmoid colon,  removed with a cold snare. Resected and retrieved.                           - Patent end-to-end colo-colonic anastomosis,                            characterized by healthy appearing  mucosa.                           - Internal hemorrhoids.                           - The examined portion of the ileum was normal. Recommendation:           - Patient has a contact number available for                            emergencies. The signs and symptoms of potential                            delayed complications were discussed with the                            patient. Return to normal activities tomorrow.                            Written discharge instructions were provided to the                            patient.                           - Resume previous diet.                           - Continue present medications.                           - Await pathology results.                           - Repeat colonoscopy in 3 years for surveillance,                            or sooner if needed based on pathology results.                           - Return to GI office PRN. Gerrit Heck, MD 11/30/2021 12:21:01 PM

## 2021-11-30 NOTE — Progress Notes (Signed)
GASTROENTEROLOGY PROCEDURE H&P NOTE   Primary Care Physician: Rudene Anda, MD    Reason for Procedure:   Colon cancer surveillance, history of colon cancer, history of colon polyps  Plan:    Colonoscopy  Patient is appropriate for endoscopic procedure(s) in the ambulatory ([place]) setting.  The nature of the procedure, as well as the risks, benefits, and alternatives were carefully and thoroughly reviewed with the patient. Ample time for discussion and questions allowed. The patient understood, was satisfied, and agreed to proceed.     HPI: Sarah [name] is a 51 y.o. female who presents for Colonsocopy for ongoing colon cancer surveillance.   history of stage I rectosigmoid cancer (T2N0) diagnosed at the time of index colonoscopy in 05/2019, s/p chemoradiation then LAR 11/24/2019.  - Colonoscopy (05/2019, Dr. Bryan Lemma): 6 cm rectosigmoid Adenocarcinoma with oozing, tattoo placed proximal and distal, internal hemorrhoids.  Distal transverse colon was extent reached due to needing smaller caliber endoscope to traverse mass. -Colonsocopy (10/2020): 4 mm ascending polyp (TA), 15 mm descending flat polyp (HP), healthy-appearing anastomosis and proximal rectum.  Grade 2 hemorrhoids.  Repeat 1 year.  Past Medical History:  Diagnosis Date   Anxiety    not on meds (11/15/2021)   Cancer ([place]) 2020   rectal cancer-chemo, surgery   Depression    not on meds (11/15/2021)   Family history of bladder cancer    GERD (gastroesophageal reflux disease)    on meds PRN    Past Surgical History:  Procedure Laterality Date   COLON SURGERY  2020   rectal cancer surgery    COLONOSCOPY  06/25/2019   Sarah [name]    COLONOSCOPY  2021   VC-MAC-2 day suprep (good)-int hems/HPP x 1-TA x 1;   POLYPECTOMY  2021   HPP x 1, TA x 1   TUBAL LIGATION  2003    Prior to Admission medications   Medication Sig Start Date End Date Taking? Authorizing Provider  CALCIUM PO Take 1 tablet by mouth daily at 6  (six) AM.   Yes [provider]  naproxen sodium (ALEVE) 220 MG tablet Take 220 mg by mouth daily as needed.    [provider]  omeprazole (PRILOSEC) 20 MG capsule Take 20 mg by mouth 2 (two) times daily as needed.    [provider]    Current Outpatient Medications  Medication Sig Dispense Refill   CALCIUM PO Take 1 tablet by mouth daily at 6 (six) AM.     naproxen sodium (ALEVE) 220 MG tablet Take 220 mg by mouth daily as needed.     omeprazole (PRILOSEC) 20 MG capsule Take 20 mg by mouth 2 (two) times daily as needed.     Current Facility-Administered Medications  Medication Dose Route Frequency Provider Last Rate Last Admin   0.9 %  sodium chloride infusion  500 mL Intravenous Once Gedalia Mcmillon V, DO        Allergies as of 11/30/2021 - Review Complete 11/30/2021  Allergen Reaction Noted   Augmentin [amoxicillin-pot clavulanate] Nausea And Vomiting 11/03/2020   Escitalopram Other (See Comments) 05/12/2020    Family History  Problem Relation Age of Onset   Colon polyps Father 26   Bladder Cancer Maternal Grandfather    Colon cancer Neg Hx    Esophageal cancer Neg Hx    Rectal cancer Neg Hx    Stomach cancer Neg Hx     Social History   Socioeconomic History   Marital status: Single  Spouse name: Not on file   Number of children: Not on file   Years of education: Not on file   Highest education level: Not on file  Occupational History   Not on file  Tobacco Use   Smoking status: Former    Packs/day: 1.00    Types: Cigarettes   Smokeless tobacco: Never  Vaping Use   Vaping Use: Never used  Substance and Sexual Activity   Alcohol use: Not Currently    Alcohol/week: 3.0 standard drinks    Types: 3 Standard drinks or equivalent per week   Drug use: No   Sexual activity: Not on file  Other Topics Concern   Not on file  Social History Narrative   Not on file   Social Determinants of Health   Financial Resource Strain: Not on  file  Food Insecurity: Not on file  Transportation Needs: Not on file  Physical Activity: Not on file  Stress: Not on file  Social Connections: Not on file  Intimate Partner Violence: Not on file    Physical Exam: Vital signs in last 24 hours: _0  104/63    Pulse 91    Temp 98.6 F (37 C)    Ht _1  (1.626 m)    Wt 100 lb (45.4 kg)    SpO2 95%    BMI 17.16 kg/m  GEN: NAD EYE: Sclerae anicteric ENT: MMM CV: Non-tachycardic Pulm: CTA b/l GI: Soft, NT/ND NEURO:  Alert & Oriented x [place], DO [facility]   11/30/2021 11:47 AM

## 2021-11-30 NOTE — Progress Notes (Signed)
Called to room to assist during endoscopic procedure.  Patient ID and intended procedure confirmed with present staff. Received instructions for my participation in the procedure from the performing physician.  

## 2021-11-30 NOTE — Progress Notes (Signed)
Report given to PACU, vss 

## 2021-12-04 ENCOUNTER — Telehealth: Payer: Self-pay

## 2021-12-04 ENCOUNTER — Telehealth: Payer: Self-pay | Admitting: *Deleted

## 2021-12-04 NOTE — Telephone Encounter (Signed)
First follow up call attempt.  LVM. 

## 2021-12-04 NOTE — Telephone Encounter (Signed)
Second attempt follow up call to pt, LM on VM ?

## 2021-12-07 ENCOUNTER — Encounter: Payer: Self-pay | Admitting: Gastroenterology

## 2022-02-27 ENCOUNTER — Other Ambulatory Visit: Payer: Self-pay

## 2022-02-27 ENCOUNTER — Encounter (HOSPITAL_BASED_OUTPATIENT_CLINIC_OR_DEPARTMENT_OTHER): Payer: Self-pay | Admitting: *Deleted

## 2022-02-27 ENCOUNTER — Emergency Department (HOSPITAL_BASED_OUTPATIENT_CLINIC_OR_DEPARTMENT_OTHER)
Admission: EM | Admit: 2022-02-27 | Discharge: 2022-02-27 | Disposition: A | Discharge status: Home / Self Care | Payer: Medicaid Other | Attending: Emergency Medicine | Admitting: Emergency Medicine

## 2022-02-27 DIAGNOSIS — W228XXA Striking against or struck by other objects, initial encounter: Secondary | ICD-10-CM | POA: Diagnosis not present

## 2022-02-27 DIAGNOSIS — S8991XA Unspecified injury of right lower leg, initial encounter: Secondary | ICD-10-CM | POA: Diagnosis present

## 2022-02-27 DIAGNOSIS — Y93K3 Activity, grooming and shearing an animal: Secondary | ICD-10-CM | POA: Insufficient documentation

## 2022-02-27 DIAGNOSIS — T148XXA Other injury of unspecified body region, initial encounter: Secondary | ICD-10-CM

## 2022-02-27 DIAGNOSIS — S81811A Laceration without foreign body, right lower leg, initial encounter: Secondary | ICD-10-CM | POA: Insufficient documentation

## 2022-02-27 MED ORDER — LIDOCAINE-EPINEPHRINE (PF) 2 %-1:200000 IJ SOLN
10.0000 mL | Freq: Once | INTRAMUSCULAR | Status: AC
  Administered 2022-02-27: 10 mL
  Filled 2022-02-27: qty 20

## 2022-02-27 MED ORDER — AMOXICILLIN-POT CLAVULANATE 875-125 MG PO TABS: 1.0000 | ORAL_TABLET | Freq: Two times a day (BID) | ORAL | 0 refills | Status: DC

## 2022-02-27 MED ORDER — ONDANSETRON HCL 4 MG PO TABS: 4.0000 mg | ORAL_TABLET | Freq: Four times a day (QID) | ORAL | 0 refills | Status: DC

## 2022-02-27 NOTE — ED Triage Notes (Signed)
Pt states she was playing with her dog and "he went for the basketball and my leg was in the way". Laceration present to right lower leg above ankle. States she cleaned wound with alcohol pta. Bleeding controlled. Lac approx 4-5 cm ?

## 2022-02-27 NOTE — ED Notes (Signed)
RLE distal neurovascular intact  ?

## 2022-02-27 NOTE — ED Notes (Signed)
Provider at bedside suturing wound, will obtain vitals when finished suturing ?

## 2022-02-27 NOTE — ED Provider Notes (Signed)
?[facility] ?Provider Note ? ? ?CSN: [id number] ?Arrival date & time: 02/27/22  1758 ? ?  ? ?History ? ?Chief Complaint  ?Patient presents with  ? Laceration  ? Animal Bite  ? ? ?Sarah [name] is a 51 y.o. female no signet past medical history presents with concern for laceration of the right ankle.  She was playing with her dog with a ball and the dog went down and hit her leg instead of the ball.  She reports that her last tetanus shot was in the last 5 years.  She denies any numbness or tingling of the foot.  She reports that her dog is up-to-date on all of its vaccinations. ? ? ?Laceration ? ?  ? ?Home Medications ?Prior to Admission medications   ?Medication Sig Start Date End Date Taking? Authorizing Provider  ?amoxicillin-clavulanate (AUGMENTIN) 875-125 MG tablet Take 1 tablet by mouth every 12 (twelve) hours. 02/27/22  Yes Cesilia Shinn H, PA-C  ?ondansetron (ZOFRAN) 4 MG tablet Take 1 tablet (4 mg total) by mouth every 6 (six) hours. 02/27/22  Yes Daisy Lites H, PA-C  ?CALCIUM PO Take 1 tablet by mouth daily at 6 (six) AM.    [provider]  ?naproxen sodium (ALEVE) 220 MG tablet Take 220 mg by mouth daily as needed.    [provider]  ?omeprazole (PRILOSEC) 20 MG capsule Take 20 mg by mouth 2 (two) times daily as needed.    [provider]  ?   ? ?Allergies    ?Augmentin [amoxicillin-pot clavulanate] and Escitalopram   ? ?Review of Systems   ?Review of Systems  ?Skin:  Positive for wound.  ?All other systems reviewed and are negative. ? ?Physical Exam ?Updated Vital Signs ?BP (!) 124/91 (BP Location: Right Arm)   Pulse 96   Temp 98.2 ?F (36.8 ?C) (Oral)   Resp 16   Ht '5\' 4"'$  (1.626 m)   Wt 45.4 kg   SpO2 97%   BMI 17.16 kg/m?  ?Physical Exam ?Vitals and nursing note reviewed.  ?Constitutional:   ?   General: She is not in acute distress. ?   Appearance: Normal appearance.  ?HENT:  ?   Head: Normocephalic and atraumatic.  ?Eyes:  ?    General:     ?   Right eye: No discharge.     ?   Left eye: No discharge.  ?Cardiovascular:  ?   Rate and Rhythm: Normal rate and regular rhythm.  ?Pulmonary:  ?   Effort: Pulmonary effort is normal. No respiratory distress.  ?Musculoskeletal:     ?   General: No deformity.  ?Skin: ?   General: Skin is warm and dry.  ?   Comments: Patient with uneven, large approximately 5 cm laceration of the anterior aspect of the right lower extremity just proximal to the ankle.  Full wound length explored, no evidence of tendon damage, breach of the muscular fascia.  Do not see any evidence of retained foreign body.  ?Neurological:  ?   Mental Status: She is alert and oriented to person, place, and time.  ?Psychiatric:     ?   Mood and Affect: Mood normal.     ?   Behavior: Behavior normal.  ? ? ?ED Results / Procedures / Treatments   ?Labs ?(all labs ordered are listed, but only abnormal results are displayed) ?Labs Reviewed - No data to display ? ?EKG ?None ? ?Radiology ?No results found. ? ?Procedures ?Marland Kitchen.Laceration Repair ? ?  Date/Time: 02/27/2022 8:10 PM ?Performed by: Anselmo Pickler, PA-C ?Authorized by: Anselmo Pickler, PA-C  ? ?Consent:  ?  Consent obtained:  Verbal ?  Consent given by:  Patient ?  Risks, benefits, and alternatives were discussed: yes   ?  Risks discussed:  Infection, need for additional repair, pain, poor cosmetic result and poor wound healing ?  Alternatives discussed:  No treatment ?Universal protocol:  ?  Procedure explained and questions answered to patient or proxy's satisfaction: yes   ?  Patient identity confirmed:  Verbally with patient ?Anesthesia:  ?  Anesthesia method:  Local infiltration ?  Local anesthetic:  Lidocaine 2% WITH epi ?Laceration details:  ?  Location:  Leg ?  Leg location:  R lower leg ?  Length (cm):  5.2 ?  Depth (mm):  12 ?Treatment:  ?  Area cleansed with:  Shur-Clens and saline ?  Amount of cleaning:  Extensive ?  Irrigation solution:  Sterile saline ?Skin repair:   ?  Repair method:  Sutures ?  Suture size:  4-0 ?  Suture material:  Prolene ?  Suture technique:  Simple interrupted ?  Number of sutures:  2 ?Approximation:  ?  Approximation:  Loose ?Repair type:  ?  Repair type:  Intermediate ?Post-procedure details:  ?  Dressing:  Antibiotic ointment and non-adherent dressing ?  Procedure completion:  Tolerated  ? ? ?Medications Ordered in ED ?Medications  ?lidocaine-EPINEPHrine (XYLOCAINE W/EPI) 2 %-1:200000 (PF) injection 10 mL (10 mLs Infiltration Given by Other 02/27/22 1947)  ? ? ?ED Course/ Medical Decision Making/ A&P ?  ?                        ?Medical Decision Making ?Risk ?Prescription drug management. ? ? ?This is an overall well-appearing 51 year old female with up-to-date tetanus who presents with concern for dog bite, laceration on the right lower extremity.  As the laceration, wound was sustained part of a dog bite injury clinical concern for infection.  Despite somewhat gaping appearance of wound we will only perform very loose closure to prevent locking in any infection.  Patient had reported history of Augmentin allergy, but she endorses that just causes her some nausea and vomiting.  Discussed with patient that I do not believe that this represents a true allergy and I am happy to prescribe her some nausea medication so that she can take the preferred agent for an animal bite.  Wound was thoroughly cleaned with saline, Shur-Clens, patient initially cleaned it with alcohol, peroxide prior to arrival.  Loosely closed with 2 sutures of not pulled tight.  Bleeding controlled with pressure.  Patient instructed to watch very closely for signs infection, discharged with Augmentin, Zofran.  Extensive return precautions given. ?Final Clinical Impression(s) / ED Diagnoses ?Final diagnoses:  ?Animal bite  ? ? ?Rx / DC Orders ?ED Discharge Orders   ? ?      Ordered  ?  amoxicillin-clavulanate (AUGMENTIN) 875-125 MG tablet  Every 12 hours       ? 02/27/22 1956  ?   ondansetron (ZOFRAN) 4 MG tablet  Every 6 hours       ? 02/27/22 1956  ? ?  ?  ? ?  ? ? ?  ?Anselmo Pickler, PA-C ?02/27/22 2012 ? ?  ?Lucrezia Starch, MD ?03/02/22 1538 ? ?

## 2022-02-27 NOTE — Discharge Instructions (Addendum)
Please monitor for signs of infection including worsening pain, redness, swelling, pus draining from the wound.  A little bit of clear drainage is expected.  I recommend that you change the bandage at least once to twice daily, apply thin layer of Polysporin or Neosporin to the affected area.  If you are concerned about the cosmetic outcome of the laceration you can consider having it revised by plastic surgeon in a few months.  If you have any concerns about infection please return to the emergency department. ?

## 2022-06-18 ENCOUNTER — Other Ambulatory Visit: Payer: Self-pay

## 2023-01-15 ENCOUNTER — Other Ambulatory Visit: Payer: Self-pay

## 2023-02-07 ENCOUNTER — Other Ambulatory Visit: Payer: Self-pay

## 2023-02-07 ENCOUNTER — Ambulatory Visit: Payer: Medicaid Other | Attending: Surgical Oncology

## 2023-02-07 DIAGNOSIS — R293 Abnormal posture: Secondary | ICD-10-CM

## 2023-02-07 DIAGNOSIS — R102 Pelvic and perineal pain unspecified side: Secondary | ICD-10-CM

## 2023-02-07 DIAGNOSIS — M6281 Muscle weakness (generalized): Secondary | ICD-10-CM | POA: Diagnosis not present

## 2023-02-07 DIAGNOSIS — R152 Fecal urgency: Secondary | ICD-10-CM | POA: Diagnosis not present

## 2023-02-07 DIAGNOSIS — M5459 Other low back pain: Secondary | ICD-10-CM | POA: Diagnosis not present

## 2023-02-07 DIAGNOSIS — R159 Full incontinence of feces: Secondary | ICD-10-CM | POA: Diagnosis not present

## 2023-02-07 DIAGNOSIS — M62838 Other muscle spasm: Secondary | ICD-10-CM | POA: Diagnosis not present

## 2023-02-07 DIAGNOSIS — C20 Malignant neoplasm of rectum: Secondary | ICD-10-CM | POA: Insufficient documentation

## 2023-02-07 DIAGNOSIS — R279 Unspecified lack of coordination: Secondary | ICD-10-CM

## 2023-02-07 NOTE — Patient Instructions (Signed)
Urge drill: Perform 5 quick and gentle squeezes of the pelvic floor when you have the strong urge to have a bowel movement Take a deep breath Calmly go to the bathroom   Squatty potty: When your knees are level or below the level of your hips, pelvic floor muscles are pressed against rectum, preventing ease of bowel movement. By getting knees above the level of the hips, these pelvic floor muscles relax, allowing easier passage of bowel movement.  Ways to get knees above hips: o Squatty Potty (7inch and 9inch versions) o Small stool o Roll of toilet paper under each foot o Hardback book or stack of magazines under each foot  Relaxed Toileting mechanics: Once in this position, make sure to lean forward with forearms on thighs, wide knees, relaxed stomach, and breathe.     [facility] [address], [place], [place] [address] [address] Phone # [phone] Fax [phone]

## 2023-02-07 NOTE — Therapy (Signed)
OUTPATIENT PHYSICAL THERAPY FEMALE PELVIC EVALUATION   Patient Name: Sarah [name] MRN: [id number] DOB:06/15/1971, 52 y.o., female Today's Date: 02/07/2023  END OF SESSION:  PT End of Session - 02/07/23 0808     Visit Number 1    Date for PT Re-Evaluation 07/25/23    Authorization Type [employer] Medicare    PT Start Time 0800    PT Stop Time 0840    PT Time Calculation (min) 40 min    Activity Tolerance Patient tolerated treatment well    Behavior During Therapy [facility] for tasks assessed/performed             Past Medical History:  Diagnosis Date   Anxiety    not on meds (11/15/2021)   Cancer ([place]) 2020   rectal cancer-chemo, surgery   Depression    not on meds (11/15/2021)   Family history of bladder cancer    GERD (gastroesophageal reflux disease)    on meds PRN   Past Surgical History:  Procedure Laterality Date   COLON SURGERY  2020   rectal cancer surgery    COLONOSCOPY  06/25/2019   [facility]    COLONOSCOPY  2021   VC-MAC-2 day suprep (good)-int hems/HPP x 1-TA x 1;   POLYPECTOMY  2021   HPP x 1, TA x 1   TUBAL LIGATION  2003   Patient Active Problem List   Diagnosis Date Noted   Genetic testing 03/01/2020   Family history of bladder cancer    Leukopenia due to antineoplastic chemotherapy ([place]) 08/12/2019   Tobacco abuse 07/22/2019   History of ETOH abuse 07/22/2019   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    PCP: Rudene Anda, MD  REFERRING PROVIDER: Stitzenberg, Clint Lipps, MD   REFERRING DIAG: C20 (ICD-10-CM) - Rectal cancer [facility])  THERAPY DIAG:  Incontinence of feces with fecal urgency  Full incontinence of feces  Pelvic pain  Other low back pain  Abnormal posture  Muscle weakness (generalized)  Other muscle spasm  Unspecified lack of coordination  Rationale for Evaluation and Treatment: Rehabilitation  ONSET DATE: end of 2020 after surgery  SUBJECTIVE:                                                                                                                                                                                            02/07/23 SUBJECTIVE STATEMENT: Pt states that she feels like after she eats she has episodes where she doesn't make it to the bathroom. Symptoms are a little inconsistent and don't always happen right after she eats. When she had colorectal cancer, she had chemo pill and radiation. She did have surgery  to remove tumor in colon and rectum. She did not have to have a colostomy bag. She is not currently undergoing any cancer treatments, just regular follow ups.  Fluid intake: Yes: 48oz water, 3 12oz cans     PAIN:  Are you having pain? Yes NPRS scale: 8/10 Pain location:  low back/pelvic pain  Pain type: dull Pain description: constant   Aggravating factors: standing Relieving factors: bath, aleve  PRECAUTIONS: None  WEIGHT BEARING RESTRICTIONS: No  FALLS:  Has patient fallen in last 6 months? No  LIVING ENVIRONMENT: Lives with: lives with their family Lives in: House/apartment   OCCUPATION: hair stylist   PLOF: Independent  PATIENT GOALS: get bowels under control  PERTINENT HISTORY:  Colorectal cancer in 2020 with radiation, chemo, and surgery; cervical dysplasia (appointment coming up to assess) Sexual abuse: No  BOWEL MOVEMENT: Pain with bowel movement: No Type of bowel movement:Type (Bristol Stool Scale) 4-6, Frequency up to 10x/day, and Strain No - tries to take deep breaths instead of straining; she will wake up 1-2x/night with severe urgency to have bowel movement Fully empty rectum: No Leakage: Yes: with severe urgency, often after meals Pads: No Fiber supplement: No  URINATION: Pain with urination: No Fully empty bladder: No Stream: Strong Urgency: No Frequency: 4-5x/day, occasional nocturia Leakage:  None Pads: No  INTERCOURSE: Pain with intercourse: During Penetration and after Ability to have vaginal penetration:  Yes: but painful Climax: low back  pain with orgasm Marinoff Scale: 1/3  PREGNANCY: Vaginal deliveries 3 Tearing No   PROLAPSE: None   OBJECTIVE:  02/07/23:  PATIENT SURVEYS:   [id number] [id number]  COGNITION: Overall cognitive status: Within functional limits for tasks assessed     SENSATION: Light touch: Appears intact Proprioception: Appears intact  FUNCTIONAL TESTS:   GAIT: Comments: WNL  POSTURE: posterior pelvic tilt   PALPATION:   General  tenderness over coccyx, lateral sacral/coccyx borders, and throughout bil posterolateral hip muscles                External Perineal Exam dryness/pallor                             Internal Pelvic Floor dryness vaginally; pain rectally with inability to progress full finger into anal canal/rectum due to high tone/scar tissue   Patient confirms identification and approves PT to assess internal pelvic floor and treatment Yes vaginal and rectal exam  PELVIC MMT:   MMT eval  Vaginal 2/5, poor coordination, no ability to hold, decreased coordination with repeat contractions (5x)  Internal Anal Sphincter 1/5  External Anal Sphincter 2/5  Puborectalis 2/5  Diastasis Recti Mild distortion, no separation, core weakness  (Blank rows = not tested)        TONE: Increased rectally  PROLAPSE: None palpated/visualized in evaluation today in supine  TODAY'S TREATMENT:  DATE:  02/07/23  EVAL  Therapeutic activities: Urge drill Squatty potty Relaxed toilet mechanics  Check all possible CPT codes: [id number] - Therapeutic Activities    Check all conditions that are expected to impact treatment: None of these apply   If treatment provided at initial evaluation, no treatment charged due to lack of authorization.       PATIENT EDUCATION:  Education details: see above Person educated: Patient Education method: Explanation, Demonstration, Tactile  cues, Verbal cues, and Handouts Education comprehension: verbalized understanding  HOME EXERCISE PROGRAM: [id number]  ASSESSMENT:  CLINICAL IMPRESSION: Patient is a 52 y.o. female who was seen today for physical therapy evaluation and treatment for fecal incontinence and low back/sacral/coccyx pain. Exam findings notable for abnormal posture, pelvic floor weakness, significant reduction in coordination of contraction, no pelvic floor endurance, high tone in posterior pelvic floor/external anal sphincter, tenderness over coccyx/sacral borders and surrounding muscles, and core weakness. Signs and symptoms are most consistent with pelvic floor/external anal sphincter weakness, high tone, and scar tissue restriction. Initial treatment consisted of relaxed toilet mechanics, squatty potty, and urge drill for bowel movements. She will benefit from skilled PT intervention in order to decrease fecal incontinence, decrease low back/coccyx pain, and address impairments.   OBJECTIVE IMPAIRMENTS: decreased activity tolerance, decreased coordination, decreased endurance, decreased strength, increased fascial restrictions, increased muscle spasms, postural dysfunction, and pain.   ACTIVITY LIMITATIONS: lifting, bending, standing, sleeping, and continence  PARTICIPATION LIMITATIONS: interpersonal relationship, community activity, and occupation  PERSONAL FACTORS: 1 comorbidity: hx colorectal cancer with radiation/chemo/surgical intervention  are also affecting patient's functional outcome.   REHAB POTENTIAL: Good  CLINICAL DECISION MAKING: Stable/uncomplicated  EVALUATION COMPLEXITY: Low   GOALS: Goals reviewed with patient? Yes  SHORT TERM GOALS: Target date: 03/14/23  Pt will be independent with HEP.   Baseline: Goal status: INITIAL  2.  Pt will be independent with diaphragmatic breathing and down training activities in order to improve pelvic floor relaxation.  Baseline:  Goal status:  INITIAL  3.  Pt will be independent with use of squatty potty, relaxed toileting mechanics, and improved bowel movement techniques in order to increase ease of bowel movements and complete evacuation.   Baseline:  Goal status: INITIAL   LONG TERM GOALS: Target date: 07/25/23  Pt will be independent with advanced HEP.   Baseline:  Goal status: INITIAL  2.  Pt will demonstrate normal pelvic floor muscle tone and A/ROM, able to achieve 4/5 strength with contractions and 10 sec endurance, in order to provide appropriate lumbopelvic support in functional activities.   Baseline:  Goal status: INITIAL  3.  Pt will report ability to stand for >60 minutes without increase in pain. Baseline:  Goal status: INITIAL  4.  Pt will report no pain greater than 4/10. Baseline:  Goal status: INITIAL  5.  Pt will report no episodes of fecal incontinence in order to improve confidence in community activities and personal hygiene.   Baseline:  Goal status: INITIAL  6.  Pt will report no greater than 2-3 bowel movements a day with complete emptying, no episodes of incontinence in between, and decreased urgency.  Baseline:  Goal status: INITIAL  PLAN:  PT FREQUENCY: 1-2x/week  PT DURATION: 6 months  PLANNED INTERVENTIONS: Therapeutic exercises, Therapeutic activity, Neuromuscular re-education, Balance training, Gait training, Patient/Family education, Self Care, Joint mobilization, Dry Needling, Biofeedback, and Manual therapy  PLAN FOR NEXT SESSION: Possible internal manual techniques to external anal sphincter/pelvic floor to reduce restriction/tone; down training/mobility exercises; core exercises.  Heather Roberts, PT, DPT03/14/2410:21 AM

## 2023-02-11 ENCOUNTER — Ambulatory Visit: Payer: Medicaid Other

## 2023-02-11 DIAGNOSIS — R279 Unspecified lack of coordination: Secondary | ICD-10-CM

## 2023-02-11 DIAGNOSIS — R159 Full incontinence of feces: Secondary | ICD-10-CM

## 2023-02-11 DIAGNOSIS — M6281 Muscle weakness (generalized): Secondary | ICD-10-CM

## 2023-02-11 DIAGNOSIS — M62838 Other muscle spasm: Secondary | ICD-10-CM

## 2023-02-11 DIAGNOSIS — C20 Malignant neoplasm of rectum: Secondary | ICD-10-CM | POA: Diagnosis not present

## 2023-02-11 DIAGNOSIS — R152 Fecal urgency: Secondary | ICD-10-CM

## 2023-02-11 DIAGNOSIS — M5459 Other low back pain: Secondary | ICD-10-CM

## 2023-02-11 DIAGNOSIS — R102 Pelvic and perineal pain: Secondary | ICD-10-CM

## 2023-02-11 DIAGNOSIS — R293 Abnormal posture: Secondary | ICD-10-CM

## 2023-02-11 NOTE — Therapy (Signed)
OUTPATIENT PHYSICAL THERAPY TREATMENT NOTE   Patient Name: Sarah [name] MRN: [id number] DOB:15-Aug-1971, 52 y.o., female Today's Date: 02/11/2023  PCP: Rudene Anda, MD REFERRING PROVIDER: Cecil Cobbs, Clint Lipps, MD  END OF SESSION:   PT End of Session - 02/11/23 1146     Visit Number 2    Date for PT Re-Evaluation 07/25/23    Authorization Type [employer] Medicare    PT Start Time [id number]    PT Stop Time 1225    PT Time Calculation (min) 40 min    Activity Tolerance Patient tolerated treatment well    Behavior During Therapy [facility] for tasks assessed/performed             Past Medical History:  Diagnosis Date   Anxiety    not on meds (11/15/2021)   Cancer ([name]) 2020   rectal cancer-chemo, surgery   Depression    not on meds (11/15/2021)   Family history of bladder cancer    GERD (gastroesophageal reflux disease)    on meds PRN   Past Surgical History:  Procedure Laterality Date   COLON SURGERY  2020   rectal cancer surgery    COLONOSCOPY  06/25/2019   Cirigliano    COLONOSCOPY  2021   VC-MAC-2 day suprep (good)-int hems/HPP x 1-TA x 1;   POLYPECTOMY  2021   HPP x 1, TA x 1   TUBAL LIGATION  2003   Patient Active Problem List   Diagnosis Date Noted   Genetic testing 03/01/2020   Family history of bladder cancer    Leukopenia due to antineoplastic chemotherapy ([place]) 08/12/2019   Tobacco abuse 07/22/2019   History of ETOH abuse 07/22/2019   Rectal adenocarcinoma ([name]) 06/25/2019    REFERRING DIAG: C20 (ICD-10-CM) - Rectal cancer ([name])  THERAPY DIAG:  Incontinence of feces with fecal urgency  Full incontinence of feces  Pelvic pain  Other low back pain  Abnormal posture  Muscle weakness (generalized)  Other muscle spasm  Unspecified lack of coordination  Rationale for Evaluation and Treatment Rehabilitation  PERTINENT HISTORY: Colorectal cancer in 2020 with radiation, chemo, and surgery; cervical dysplasia (appointment coming up to  assess)  PRECAUTIONS: hx of cancer  SUBJECTIVE:                                                                                                                                                                                      SUBJECTIVE STATEMENT:  Pt states that she has been using furniture risers as squatty potty and feels like it is helpful, but she still is going to the bathroom very frequently - 5 times this morning.    PAIN:  Are  you having pain? Yes NPRS scale: 8/10 Pain location:  low back/pelvic pain   Pain type: dull Pain description: constant    Aggravating factors: standing Relieving factors: bath, aleve  02/07/23 SUBJECTIVE STATEMENT: Pt states that she feels like after she eats she has episodes where she doesn't make it to the bathroom. Symptoms are a little inconsistent and don't always happen right after she eats. When she had colorectal cancer, she had chemo pill and radiation. She did have surgery to remove tumor in colon and rectum. She did not have to have a colostomy bag. She is not currently undergoing any cancer treatments, just regular follow ups.  Fluid intake: Yes: 48oz water, 3 12oz cans      PAIN:  Are you having pain? Yes NPRS scale: 8/10 Pain location:  low back/pelvic pain   Pain type: dull Pain description: constant    Aggravating factors: standing Relieving factors: bath, aleve   PRECAUTIONS: None   WEIGHT BEARING RESTRICTIONS: No   FALLS:  Has patient fallen in last 6 months? No   LIVING ENVIRONMENT: Lives with: lives with their family Lives in: House/apartment     OCCUPATION: hair stylist    PLOF: Independent   PATIENT GOALS: get bowels under control   PERTINENT HISTORY:  Colorectal cancer in 2020 with radiation, chemo, and surgery; cervical dysplasia (appointment coming up to assess) Sexual abuse: No   BOWEL MOVEMENT: Pain with bowel movement: No Type of bowel movement:Type (Bristol Stool Scale) 4-6, Frequency up to  10x/day, and Strain No - tries to take deep breaths instead of straining; she will wake up 1-2x/night with severe urgency to have bowel movement Fully empty rectum: No Leakage: Yes: with severe urgency, often after meals Pads: No Fiber supplement: No   URINATION: Pain with urination: No Fully empty bladder: No Stream: Strong Urgency: No Frequency: 4-5x/day, occasional nocturia Leakage:  None Pads: No   INTERCOURSE: Pain with intercourse: During Penetration and after Ability to have vaginal penetration:  Yes: but painful Climax: low back pain with orgasm Marinoff Scale: 1/3   PREGNANCY: Vaginal deliveries 3 Tearing No     PROLAPSE: None     OBJECTIVE:  02/07/23:   PATIENT SURVEYS:    PFIQ-7 62   COGNITION: Overall cognitive status: Within functional limits for tasks assessed                          SENSATION: Light touch: Appears intact Proprioception: Appears intact   FUNCTIONAL TESTS:    GAIT: Comments: WNL   POSTURE: posterior pelvic tilt     PALPATION:   General  tenderness over coccyx, lateral sacral/coccyx borders, and throughout bil posterolateral hip muscles                 External Perineal Exam dryness/pallor                             Internal Pelvic Floor dryness vaginally; pain rectally with inability to progress full finger into anal canal/rectum due to high tone/scar tissue    Patient confirms identification and approves PT to assess internal pelvic floor and treatment Yes vaginal and rectal exam   PELVIC MMT:   MMT eval  Vaginal 2/5, poor coordination, no ability to hold, decreased coordination with repeat contractions (5x)  Internal Anal Sphincter 1/5  External Anal Sphincter 2/5  Puborectalis 2/5  Diastasis Recti Mild distortion, no separation, core weakness  (  Blank rows = not tested)         TONE: Increased rectally   PROLAPSE: None palpated/visualized in evaluation today in supine   TODAY'S TREATMENT:                                                                                                                               DATE:  02/11/23 Neuromuscular re-education: Diaphragmatic breathing  Butterfly supine 5 minutes Cat cow 2 x 10 Child's pose 10 breaths  Exercises: Lower trunk rotation with wide feet 2 x 10 Supine piriformis stretch 60 seconds bil Seated pelvic tilts 2 x 10 Therapeutic activities: Urge drill specifics - getting into gentle scheduling Bowel journal   02/07/23  EVAL  Therapeutic activities: Urge drill Squatty potty Relaxed toilet mechanics   Check all possible CPT codes: 97530 - Therapeutic Activities                        Check all conditions that are expected to impact treatment: None of these apply      If treatment provided at initial evaluation, no treatment charged due to lack of authorization.                                PATIENT EDUCATION:  Education details: see above Person educated: Patient Education method: Explanation, Demonstration, Tactile cues, Verbal cues, and Handouts Education comprehension: verbalized understanding   HOME EXERCISE PROGRAM: [id number]   ASSESSMENT:   CLINICAL IMPRESSION: We reviewed urge drill and corrected how patient is using it so that she does not perform on the toilet after a bowel movement, but when she has urgency between bowel movements/having just had one. Our goal right now is to extend time between bowel movements and get to the point where she is having less frequent ones. She demonstrates good understanding of these goals and is agreeable to keep bowel journal. She did very well with diaphragmatic breathing, mobility exercises, and down training activities. She will benefit from skilled PT intervention in order to decrease fecal incontinence, decrease low back/coccyx pain, and address impairments.    OBJECTIVE IMPAIRMENTS: decreased activity tolerance, decreased coordination, decreased endurance, decreased strength,  increased fascial restrictions, increased muscle spasms, postural dysfunction, and pain.    ACTIVITY LIMITATIONS: lifting, bending, standing, sleeping, and continence   PARTICIPATION LIMITATIONS: interpersonal relationship, community activity, and occupation   PERSONAL FACTORS: 1 comorbidity: hx colorectal cancer with radiation/chemo/surgical intervention  are also affecting patient's functional outcome.    REHAB POTENTIAL: Good   CLINICAL DECISION MAKING: Stable/uncomplicated   EVALUATION COMPLEXITY: Low     GOALS: Goals reviewed with patient? Yes   SHORT TERM GOALS: Target date: 03/14/23   Pt will be independent with HEP.    Baseline: Goal status: INITIAL   2.  Pt will be independent with diaphragmatic breathing and down training activities in order to improve  pelvic floor relaxation.   Baseline:  Goal status: INITIAL   3.  Pt will be independent with use of squatty potty, relaxed toileting mechanics, and improved bowel movement techniques in order to increase ease of bowel movements and complete evacuation.    Baseline:  Goal status: INITIAL     LONG TERM GOALS: Target date: 07/25/23   Pt will be independent with advanced HEP.    Baseline:  Goal status: INITIAL   2.  Pt will demonstrate normal pelvic floor muscle tone and A/ROM, able to achieve 4/5 strength with contractions and 10 sec endurance, in order to provide appropriate lumbopelvic support in functional activities.    Baseline:  Goal status: INITIAL   3.  Pt will report ability to stand for >60 minutes without increase in pain. Baseline:  Goal status: INITIAL   4.  Pt will report no pain greater than 4/10. Baseline:  Goal status: INITIAL   5.  Pt will report no episodes of fecal incontinence in order to improve confidence in community activities and personal hygiene.    Baseline:  Goal status: INITIAL   6.  Pt will report no greater than 2-3 bowel movements a day with complete emptying, no  episodes of incontinence in between, and decreased urgency.  Baseline:  Goal status: INITIAL   PLAN:   PT FREQUENCY: 1-2x/week   PT DURATION: 6 months   PLANNED INTERVENTIONS: Therapeutic exercises, Therapeutic activity, Neuromuscular re-education, Balance training, Gait training, Patient/Family education, Self Care, Joint mobilization, Dry Needling, Biofeedback, and Manual therapy   PLAN FOR NEXT SESSION: Possible internal manual techniques to external anal sphincter/pelvic floor to reduce restriction/tone; down training/mobility exercises; core exercises.   Heather Roberts, PT, DPT03/18/2412:27 PM

## 2023-02-26 ENCOUNTER — Other Ambulatory Visit: Payer: Self-pay

## 2023-03-04 ENCOUNTER — Ambulatory Visit: Payer: Medicaid Other | Attending: Surgical Oncology

## 2023-03-04 DIAGNOSIS — M5459 Other low back pain: Secondary | ICD-10-CM | POA: Insufficient documentation

## 2023-03-04 DIAGNOSIS — M6281 Muscle weakness (generalized): Secondary | ICD-10-CM | POA: Diagnosis present

## 2023-03-04 DIAGNOSIS — R159 Full incontinence of feces: Secondary | ICD-10-CM | POA: Insufficient documentation

## 2023-03-04 DIAGNOSIS — M62838 Other muscle spasm: Secondary | ICD-10-CM | POA: Insufficient documentation

## 2023-03-04 DIAGNOSIS — R102 Pelvic and perineal pain: Secondary | ICD-10-CM | POA: Diagnosis present

## 2023-03-04 DIAGNOSIS — R293 Abnormal posture: Secondary | ICD-10-CM | POA: Insufficient documentation

## 2023-03-04 DIAGNOSIS — R152 Fecal urgency: Secondary | ICD-10-CM | POA: Insufficient documentation

## 2023-03-04 DIAGNOSIS — R279 Unspecified lack of coordination: Secondary | ICD-10-CM | POA: Insufficient documentation

## 2023-03-04 NOTE — Therapy (Signed)
OUTPATIENT PHYSICAL THERAPY TREATMENT NOTE   Patient Name: Sarah AquasLaura H Summa MRN: [id number] DOB:Aug 16, 1971, 52 y.o., female Today's Date: 03/04/2023  PCP: Truett PernaWang, Yun, MD REFERRING PROVIDER: Glennis BrinkStitzenberg, Scheryl MartenKaren Beth, MD  END OF SESSION:   PT End of Session - 03/04/23 1059     Visit Number 3    Date for PT Re-Evaluation 07/25/23    Authorization Type [employer] Medicare    PT Start Time 1100    PT Stop Time 1140    PT Time Calculation (min) 40 min    Activity Tolerance Patient tolerated treatment well    [facility] tasks assessed/performed             Past Medical History:  Diagnosis Date   Anxiety    not on meds (11/15/2021)   Cancer 2020   rectal cancer-chemo, surgery   Depression    not on meds (11/15/2021)   Family history of bladder cancer    GERD (gastroesophageal reflux disease)    on meds PRN   Past Surgical History:  Procedure Laterality Date   COLON SURGERY  2020   rectal cancer surgery    COLONOSCOPY  06/25/2019   Cirigliano    COLONOSCOPY  2021   VC-MAC-2 day suprep (good)-int hems/HPP x 1-TA x 1;   POLYPECTOMY  2021   HPP x 1, TA x 1   TUBAL LIGATION  2003   Patient Active Problem List   Diagnosis Date Noted   Genetic testing 03/01/2020   Family history of bladder cancer    Leukopenia due to antineoplastic chemotherapy 08/12/2019   Tobacco abuse 07/22/2019   History of ETOH abuse 07/22/2019   Rectal adenocarcinoma 06/25/2019    REFERRING DIAG: C20 (ICD-10-CM) - Rectal cancer ([facility])  THERAPY DIAG:  Incontinence of feces with fecal urgency  Full incontinence of feces  Pelvic pain  Other low back pain  Abnormal posture  Muscle weakness (generalized)  Other muscle spasm  Unspecified lack of coordination  Rationale for Evaluation and Treatment Rehabilitation  PERTINENT HISTORY: Colorectal cancer in 2020 with radiation, chemo, and surgery; cervical dysplasia (appointment coming up to assess)  PRECAUTIONS: hx of  cancer  SUBJECTIVE:                                                                                                                                                                                      SUBJECTIVE STATEMENT:  Pt states that she has not had any severe urgency since her last appointment. She is still having difficulty getting all of bowel movement out at once, but she is seeing improvement. She typically is only going to the bathroom 3x to  get everything out.    PAIN:  Are you having pain? Yes NPRS scale: 4/10 Pain location:  low back/pelvic pain   Pain type: dull Pain description: constant    Aggravating factors: standing Relieving factors: bath, aleve  02/07/23 SUBJECTIVE STATEMENT: Pt states that she feels like after she eats she has episodes where she doesn't make it to the bathroom. Symptoms are a little inconsistent and don't always happen right after she eats. When she had colorectal cancer, she had chemo pill and radiation. She did have surgery to remove tumor in colon and rectum. She did not have to have a colostomy bag. She is not currently undergoing any cancer treatments, just regular follow ups.  Fluid intake: Yes: 48oz water, 3 12oz cans      PAIN:  Are you having pain? Yes NPRS scale: 8/10 Pain location:  low back/pelvic pain   Pain type: dull Pain description: constant    Aggravating factors: standing Relieving factors: bath, aleve   PRECAUTIONS: None   WEIGHT BEARING RESTRICTIONS: No   FALLS:  Has patient fallen in last 6 months? No   LIVING ENVIRONMENT: Lives with: lives with their family Lives in: House/apartment     OCCUPATION: hair stylist    PLOF: Independent   PATIENT GOALS: get bowels under control   PERTINENT HISTORY:  Colorectal cancer in 2020 with radiation, chemo, and surgery; cervical dysplasia (appointment coming up to assess) Sexual abuse: No   BOWEL MOVEMENT: Pain with bowel movement: No Type of bowel movement:Type  (Bristol Stool Scale) 4-6, Frequency up to 10x/day, and Strain No - tries to take deep breaths instead of straining; she will wake up 1-2x/night with severe urgency to have bowel movement Fully empty rectum: No Leakage: Yes: with severe urgency, often after meals Pads: No Fiber supplement: No   URINATION: Pain with urination: No Fully empty bladder: No Stream: Strong Urgency: No Frequency: 4-5x/day, occasional nocturia Leakage:  None Pads: No   INTERCOURSE: Pain with intercourse: During Penetration and after Ability to have vaginal penetration:  Yes: but painful Climax: low back pain with orgasm Marinoff Scale: 1/3   PREGNANCY: Vaginal deliveries 3 Tearing No     PROLAPSE: None     OBJECTIVE:  02/07/23:   PATIENT SURVEYS:    PFIQ-7 62   COGNITION: Overall cognitive status: Within functional limits for tasks assessed                          SENSATION: Light touch: Appears intact Proprioception: Appears intact   FUNCTIONAL TESTS:    GAIT: Comments: WNL   POSTURE: posterior pelvic tilt     PALPATION:   General  tenderness over coccyx, lateral sacral/coccyx borders, and throughout bil posterolateral hip muscles                 External Perineal Exam dryness/pallor                             Internal Pelvic Floor dryness vaginally; pain rectally with inability to progress full finger into anal canal/rectum due to high tone/scar tissue    Patient confirms identification and approves PT to assess internal pelvic floor and treatment Yes vaginal and rectal exam   PELVIC MMT:   MMT eval  Vaginal 2/5, poor coordination, no ability to hold, decreased coordination with repeat contractions (5x)  Internal Anal Sphincter 1/5  External Anal Sphincter 2/5  Puborectalis 2/5  Diastasis Recti Mild distortion, no separation, core weakness  (Blank rows = not tested)         TONE: Increased rectally   PROLAPSE: None palpated/visualized in evaluation today in  supine   TODAY'S TREATMENT:                                                                                                                              DATE:  03/04/23 Manual: Bowel mobilization Myofascial release to lumbar paraspinals, obliques, glutes, base of sacrum Neuromuscular re-education: Pelvic floor bulge training on swiss ball Pelvic tilts on swiss ball Exercises: Seated forward fold 15 breaths Seated lateral flexion 15 breaths bil   02/11/23 Neuromuscular re-education: Diaphragmatic breathing  Butterfly supine 5 minutes Cat cow 2 x 10 Child's pose 10 breaths  Exercises: Lower trunk rotation with wide feet 2 x 10 Supine piriformis stretch 60 seconds bil Seated pelvic tilts 2 x 10 Therapeutic activities: Urge drill specifics - getting into gentle scheduling Bowel journal   02/07/23  EVAL  Therapeutic activities: Urge drill Squatty potty Relaxed toilet mechanics   Check all possible CPT codes: 56701 - Therapeutic Activities                        Check all conditions that are expected to impact treatment: None of these apply      If treatment provided at initial evaluation, no treatment charged due to lack of authorization.                                PATIENT EDUCATION:  Education details: see above Person educated: Patient Education method: Explanation, Demonstration, Tactile cues, Verbal cues, and Handouts Education comprehension: verbalized understanding   HOME EXERCISE PROGRAM: [id number]   ASSESSMENT:   CLINICAL IMPRESSION: Pt seeing excellent progress with severe urgency of bowel movement and BM frequency. We performed manual techniques to bowel, low back, and base of sacrum/glutes to help reduce restriction. Training for improved diaphragmatic breathing to help decrease abdominal pressure and improve pelvic floor relaxation training. Further pelvic floor relaxation training performed using swiss ball for feedback. Good tolerance to all  exercises. She will benefit from skilled PT intervention in order to decrease fecal incontinence, decrease low back/coccyx pain, and address impairments.    OBJECTIVE IMPAIRMENTS: decreased activity tolerance, decreased coordination, decreased endurance, decreased strength, increased fascial restrictions, increased muscle spasms, postural dysfunction, and pain.    ACTIVITY LIMITATIONS: lifting, bending, standing, sleeping, and continence   PARTICIPATION LIMITATIONS: interpersonal relationship, community activity, and occupation   PERSONAL FACTORS: 1 comorbidity: hx colorectal cancer with radiation/chemo/surgical intervention  are also affecting patient's functional outcome.    REHAB POTENTIAL: Good   CLINICAL DECISION MAKING: Stable/uncomplicated   EVALUATION COMPLEXITY: Low     GOALS: Goals reviewed with patient? Yes   SHORT TERM GOALS: Target date: 03/14/23   Pt will be independent with  HEP.    Baseline: Goal status: INITIAL   2.  Pt will be independent with diaphragmatic breathing and down training activities in order to improve pelvic floor relaxation.   Baseline:  Goal status: INITIAL   3.  Pt will be independent with use of squatty potty, relaxed toileting mechanics, and improved bowel movement techniques in order to increase ease of bowel movements and complete evacuation.    Baseline:  Goal status: INITIAL     LONG TERM GOALS: Target date: 07/25/23   Pt will be independent with advanced HEP.    Baseline:  Goal status: INITIAL   2.  Pt will demonstrate normal pelvic floor muscle tone and A/ROM, able to achieve 4/5 strength with contractions and 10 sec endurance, in order to provide appropriate lumbopelvic support in functional activities.    Baseline:  Goal status: INITIAL   3.  Pt will report ability to stand for >60 minutes without increase in pain. Baseline:  Goal status: INITIAL   4.  Pt will report no pain greater than 4/10. Baseline:  Goal status:  INITIAL   5.  Pt will report no episodes of fecal incontinence in order to improve confidence in community activities and personal hygiene.    Baseline:  Goal status: INITIAL   6.  Pt will report no greater than 2-3 bowel movements a day with complete emptying, no episodes of incontinence in between, and decreased urgency.  Baseline:  Goal status: INITIAL   PLAN:   PT FREQUENCY: 1-2x/week   PT DURATION: 6 months   PLANNED INTERVENTIONS: Therapeutic exercises, Therapeutic activity, Neuromuscular re-education, Balance training, Gait training, Patient/Family education, Self Care, Joint mobilization, Dry Needling, Biofeedback, and Manual therapy   PLAN FOR NEXT SESSION: Possible internal manual techniques to external anal sphincter/pelvic floor to reduce restriction/tone; down training/mobility exercises; core exercises.   Julio Alm, PT, DPT04/06/2410:49 AM

## 2023-03-25 ENCOUNTER — Ambulatory Visit: Payer: Medicaid Other

## 2023-03-25 DIAGNOSIS — R152 Fecal urgency: Secondary | ICD-10-CM

## 2023-03-25 DIAGNOSIS — R293 Abnormal posture: Secondary | ICD-10-CM

## 2023-03-25 DIAGNOSIS — M6281 Muscle weakness (generalized): Secondary | ICD-10-CM

## 2023-03-25 DIAGNOSIS — R279 Unspecified lack of coordination: Secondary | ICD-10-CM

## 2023-03-25 DIAGNOSIS — M5459 Other low back pain: Secondary | ICD-10-CM

## 2023-03-25 DIAGNOSIS — R159 Full incontinence of feces: Secondary | ICD-10-CM

## 2023-03-25 DIAGNOSIS — M62838 Other muscle spasm: Secondary | ICD-10-CM

## 2023-03-25 DIAGNOSIS — R102 Pelvic and perineal pain: Secondary | ICD-10-CM

## 2023-03-25 NOTE — Therapy (Signed)
OUTPATIENT PHYSICAL THERAPY TREATMENT NOTE   Patient Name: Sarah [name] MRN: [id number] DOB:Mar 23, 1971, 52 y.o., female Today's Date: 03/25/2023  PCP: Truett Perna, MD REFERRING PROVIDER: Glennis Brink, Scheryl Marten, MD  END OF SESSION:   PT End of Session - 03/25/23 1449     Visit Number 4    Date for PT Re-Evaluation 07/25/23    Authorization Type [employer] Medicare    PT Start Time 1445    PT Stop Time 1525    PT Time Calculation (min) 40 min    Activity Tolerance Patient tolerated treatment well    Behavior During Therapy [facility] for tasks assessed/performed              Past Medical History:  Diagnosis Date   Anxiety    not on meds (11/15/2021)   Cancer (HCC) 2020   rectal cancer-chemo, surgery   Depression    not on meds (11/15/2021)   Family history of bladder cancer    GERD (gastroesophageal reflux disease)    on meds PRN   Past Surgical History:  Procedure Laterality Date   COLON SURGERY  2020   rectal cancer surgery    COLONOSCOPY  06/25/2019   Cirigliano    COLONOSCOPY  2021   VC-MAC-2 day suprep (good)-int hems/HPP x 1-TA x 1;   POLYPECTOMY  2021   HPP x 1, TA x 1   TUBAL LIGATION  2003   Patient Active Problem List   Diagnosis Date Noted   Genetic testing 03/01/2020   Family history of bladder cancer    Leukopenia due to antineoplastic chemotherapy (HCC) 08/12/2019   Tobacco abuse 07/22/2019   History of ETOH abuse 07/22/2019   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    REFERRING DIAG: C20 (ICD-10-CM) - Rectal cancer (HCC)  THERAPY DIAG:  Incontinence of feces with fecal urgency  Full incontinence of feces  Pelvic pain  Other low back pain  Abnormal posture  Muscle weakness (generalized)  Other muscle spasm  Unspecified lack of coordination  Rationale for Evaluation and Treatment Rehabilitation  PERTINENT HISTORY: Colorectal cancer in 2020 with radiation, chemo, and surgery; cervical dysplasia (appointment coming up to  assess)  PRECAUTIONS: hx of cancer  SUBJECTIVE:                                                                                                                                                                                      SUBJECTIVE STATEMENT:  Pt states that she is still having about 3 bowel movements a day. She is not having to rush to get to the bathroom. There is more stool in each bowel movement and she is not feeling bloated.  She has not had any episodes of constipation. She feels like back pain is better overall and she is not currently feeling any pain.    PAIN:  Are you having pain? Yes NPRS scale: 0/10 Pain location:  low back/pelvic pain   Pain type: dull Pain description: constant    Aggravating factors: standing Relieving factors: bath, aleve  02/07/23 SUBJECTIVE STATEMENT: Pt states that she feels like after she eats she has episodes where she doesn't make it to the bathroom. Symptoms are a little inconsistent and don't always happen right after she eats. When she had colorectal cancer, she had chemo pill and radiation. She did have surgery to remove tumor in colon and rectum. She did not have to have a colostomy bag. She is not currently undergoing any cancer treatments, just regular follow ups.  Fluid intake: Yes: 48oz water, 3 12oz cans      PAIN:  Are you having pain? Yes NPRS scale: 8/10 Pain location:  low back/pelvic pain   Pain type: dull Pain description: constant    Aggravating factors: standing Relieving factors: bath, aleve   PRECAUTIONS: None   WEIGHT BEARING RESTRICTIONS: No   FALLS:  Has patient fallen in last 6 months? No   LIVING ENVIRONMENT: Lives with: lives with their family Lives in: House/apartment     OCCUPATION: hair stylist    PLOF: Independent   PATIENT GOALS: get bowels under control   PERTINENT HISTORY:  Colorectal cancer in 2020 with radiation, chemo, and surgery; cervical dysplasia (appointment coming up to  assess) Sexual abuse: No   BOWEL MOVEMENT: Pain with bowel movement: No Type of bowel movement:Type (Bristol Stool Scale) 4-6, Frequency up to 10x/day, and Strain No - tries to take deep breaths instead of straining; she will wake up 1-2x/night with severe urgency to have bowel movement Fully empty rectum: No Leakage: Yes: with severe urgency, often after meals Pads: No Fiber supplement: No   URINATION: Pain with urination: No Fully empty bladder: No Stream: Strong Urgency: No Frequency: 4-5x/day, occasional nocturia Leakage:  None Pads: No   INTERCOURSE: Pain with intercourse: During Penetration and after Ability to have vaginal penetration:  Yes: but painful Climax: low back pain with orgasm Marinoff Scale: 1/3   PREGNANCY: Vaginal deliveries 3 Tearing No     PROLAPSE: None     OBJECTIVE:  02/07/23:   PATIENT SURVEYS:    PFIQ-7 62   COGNITION: Overall cognitive status: Within functional limits for tasks assessed                          SENSATION: Light touch: Appears intact Proprioception: Appears intact   FUNCTIONAL TESTS:    GAIT: Comments: WNL   POSTURE: posterior pelvic tilt     PALPATION:   General  tenderness over coccyx, lateral sacral/coccyx borders, and throughout bil posterolateral hip muscles                 External Perineal Exam dryness/pallor                             Internal Pelvic Floor dryness vaginally; pain rectally with inability to progress full finger into anal canal/rectum due to high tone/scar tissue    Patient confirms identification and approves PT to assess internal pelvic floor and treatment Yes vaginal and rectal exam   PELVIC MMT:   MMT eval  Vaginal 2/5, poor coordination, no ability  to hold, decreased coordination with repeat contractions (5x)  Internal Anal Sphincter 1/5  External Anal Sphincter 2/5  Puborectalis 2/5  Diastasis Recti Mild distortion, no separation, core weakness  (Blank rows = not  tested)         TONE: Increased rectally   PROLAPSE: None palpated/visualized in evaluation today in supine   TODAY'S TREATMENT:                                                                                                                              DATE:  03/25/23 Neuromuscular re-education: Transversus abdominus training with multimodal cues for improved motor control and breath coordination Supine UE ball press 2 x 10 Hooklying hip adduction with core contraction Side lying UE ball press 2 x 10 bil Supine march 2 x 10 Bridge with hip adduction 2 x 10   03/04/23 Manual: Bowel mobilization Myofascial release to lumbar paraspinals, obliques, glutes, base of sacrum Neuromuscular re-education: Pelvic floor bulge training on swiss ball Pelvic tilts on swiss ball Exercises: Seated forward fold 15 breaths Seated lateral flexion 15 breaths bil   02/11/23 Neuromuscular re-education: Diaphragmatic breathing  Butterfly supine 5 minutes Cat cow 2 x 10 Child's pose 10 breaths  Exercises: Lower trunk rotation with wide feet 2 x 10 Supine piriformis stretch 60 seconds bil Seated pelvic tilts 2 x 10 Therapeutic activities: Urge drill specifics - getting into gentle scheduling Bowel journal    PATIENT EDUCATION:  Education details: see above Person educated: Patient Education method: Explanation, Demonstration, Tactile cues, Verbal cues, and Handouts Education comprehension: verbalized understanding   HOME EXERCISE PROGRAM: [id number]   ASSESSMENT:   CLINICAL IMPRESSION: Pt overall is making excellent progress with fecal urgency, decreasing low back pain, and more consistent/appropriate bowel movements. She did veyr well with core training today after initial multimodal cues to facilitate appropriate contraction. Good tolerance to all exercise progressions and HEP updated. She will benefit from skilled PT intervention in order to decrease fecal incontinence, decrease low  back/coccyx pain, and address impairments.    OBJECTIVE IMPAIRMENTS: decreased activity tolerance, decreased coordination, decreased endurance, decreased strength, increased fascial restrictions, increased muscle spasms, postural dysfunction, and pain.    ACTIVITY LIMITATIONS: lifting, bending, standing, sleeping, and continence   PARTICIPATION LIMITATIONS: interpersonal relationship, community activity, and occupation   PERSONAL FACTORS: 1 comorbidity: hx colorectal cancer with radiation/chemo/surgical intervention  are also affecting patient's functional outcome.    REHAB POTENTIAL: Good   CLINICAL DECISION MAKING: Stable/uncomplicated   EVALUATION COMPLEXITY: Low     GOALS: Goals reviewed with patient? Yes   SHORT TERM GOALS: Target date: 03/14/23 - updated 03/25/23   Pt will be independent with HEP.    Baseline: Goal status: MET 03/25/23   2.  Pt will be independent with diaphragmatic breathing and down training activities in order to improve pelvic floor relaxation.   Baseline:  Goal status: MET 03/25/23   3.  Pt will be independent with use of  squatty potty, relaxed toileting mechanics, and improved bowel movement techniques in order to increase ease of bowel movements and complete evacuation.    Baseline:  Goal status: MET 03/25/23     LONG TERM GOALS: Target date: 07/25/23 - updated 03/25/23   Pt will be independent with advanced HEP.    Baseline:  Goal status: IN PROGRESS   2.  Pt will demonstrate normal pelvic floor muscle tone and A/ROM, able to achieve 4/5 strength with contractions and 10 sec endurance, in order to provide appropriate lumbopelvic support in functional activities.    Baseline:  Goal status: IN PROGRESS   3.  Pt will report ability to stand for >60 minutes without increase in pain. Baseline:  Goal status: IN PROGRESS   4.  Pt will report no pain greater than 4/10. Baseline:  Goal status: IN PROGRESS   5.  Pt will report no episodes of  fecal incontinence in order to improve confidence in community activities and personal hygiene.    Baseline:  Goal status: IN PROGRESS   6.  Pt will report no greater than 2-3 bowel movements a day with complete emptying, no episodes of incontinence in between, and decreased urgency.  Baseline:  Goal status: IN PROGRESS   PLAN:   PT FREQUENCY: 1-2x/week   PT DURATION: 6 months   PLANNED INTERVENTIONS: Therapeutic exercises, Therapeutic activity, Neuromuscular re-education, Balance training, Gait training, Patient/Family education, Self Care, Joint mobilization, Dry Needling, Biofeedback, and Manual therapy   PLAN FOR NEXT SESSION:  Plan to progress core strengthening and appropriate relaxation.   Julio Alm, PT, DPT04/29/243:29 PM

## 2023-04-01 ENCOUNTER — Ambulatory Visit: Payer: Medicaid Other

## 2023-04-08 ENCOUNTER — Ambulatory Visit: Payer: Medicaid Other | Attending: Surgical Oncology

## 2023-04-08 DIAGNOSIS — R152 Fecal urgency: Secondary | ICD-10-CM | POA: Diagnosis present

## 2023-04-08 DIAGNOSIS — M5459 Other low back pain: Secondary | ICD-10-CM

## 2023-04-08 DIAGNOSIS — R293 Abnormal posture: Secondary | ICD-10-CM | POA: Diagnosis present

## 2023-04-08 DIAGNOSIS — R159 Full incontinence of feces: Secondary | ICD-10-CM | POA: Diagnosis present

## 2023-04-08 DIAGNOSIS — M6281 Muscle weakness (generalized): Secondary | ICD-10-CM | POA: Diagnosis present

## 2023-04-08 DIAGNOSIS — R102 Pelvic and perineal pain: Secondary | ICD-10-CM | POA: Diagnosis present

## 2023-04-08 DIAGNOSIS — M62838 Other muscle spasm: Secondary | ICD-10-CM | POA: Diagnosis present

## 2023-04-08 DIAGNOSIS — R279 Unspecified lack of coordination: Secondary | ICD-10-CM

## 2023-04-08 NOTE — Therapy (Signed)
OUTPATIENT PHYSICAL THERAPY TREATMENT NOTE   Patient Name: Sarah [name] MRN: [id number] DOB:May 11, 1971, 52 y.o., female Today's Date: 04/08/2023  PCP: Truett Perna, MD REFERRING PROVIDER: Glennis Brink, Scheryl Marten, MD  END OF SESSION:   PT End of Session - 04/08/23 1445     Visit Number 5    Date for PT Re-Evaluation 07/25/23    Authorization Type [employer] Medicare    PT Start Time 1445    PT Stop Time 1525    PT Time Calculation (min) 40 min    Activity Tolerance Patient tolerated treatment well    Behavior During Therapy [facility] for tasks assessed/performed              Past Medical History:  Diagnosis Date   Anxiety    not on meds (11/15/2021)   Cancer (HCC) 2020   rectal cancer-chemo, surgery   Depression    not on meds (11/15/2021)   Family history of bladder cancer    GERD (gastroesophageal reflux disease)    on meds PRN   Past Surgical History:  Procedure Laterality Date   COLON SURGERY  2020   rectal cancer surgery    COLONOSCOPY  06/25/2019   Cirigliano    COLONOSCOPY  2021   VC-MAC-2 day suprep (good)-int hems/HPP x 1-TA x 1;   POLYPECTOMY  2021   HPP x 1, TA x 1   TUBAL LIGATION  2003   Patient Active Problem List   Diagnosis Date Noted   Genetic testing 03/01/2020   Family history of bladder cancer    Leukopenia due to antineoplastic chemotherapy (HCC) 08/12/2019   Tobacco abuse 07/22/2019   History of ETOH abuse 07/22/2019   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    REFERRING DIAG: C20 (ICD-10-CM) - Rectal cancer (HCC)  THERAPY DIAG:  Incontinence of feces with fecal urgency  Full incontinence of feces  Pelvic pain  Other low back pain  Abnormal posture  Muscle weakness (generalized)  Other muscle spasm  Unspecified lack of coordination  Rationale for Evaluation and Treatment Rehabilitation  PERTINENT HISTORY: Colorectal cancer in 2020 with radiation, chemo, and surgery; cervical dysplasia (appointment coming up to  assess)  PRECAUTIONS: hx of cancer  SUBJECTIVE:                                                                                                                                                                                      SUBJECTIVE STATEMENT:  Pt states that she is still having about 3 bowel movements a day. She is not having to rush to get to the bathroom. There is more stool in each bowel movement and she is not feeling bloated.  She has not had any episodes of constipation. She feels like back pain is better overall and she is not currently feeling any pain.    PAIN:  Are you having pain? Yes NPRS scale: 0/10 Pain location:  low back/pelvic pain   Pain type: dull Pain description: constant    Aggravating factors: standing Relieving factors: bath, aleve  02/07/23 SUBJECTIVE STATEMENT: Pt states that she feels like after she eats she has episodes where she doesn't make it to the bathroom. Symptoms are a little inconsistent and don't always happen right after she eats. When she had colorectal cancer, she had chemo pill and radiation. She did have surgery to remove tumor in colon and rectum. She did not have to have a colostomy bag. She is not currently undergoing any cancer treatments, just regular follow ups.  Fluid intake: Yes: 48oz water, 3 12oz cans      PAIN:  Are you having pain? Yes NPRS scale: 8/10 Pain location:  low back/pelvic pain   Pain type: dull Pain description: constant    Aggravating factors: standing Relieving factors: bath, aleve   PRECAUTIONS: None   WEIGHT BEARING RESTRICTIONS: No   FALLS:  Has patient fallen in last 6 months? No   LIVING ENVIRONMENT: Lives with: lives with their family Lives in: House/apartment     OCCUPATION: hair stylist    PLOF: Independent   PATIENT GOALS: get bowels under control   PERTINENT HISTORY:  Colorectal cancer in 2020 with radiation, chemo, and surgery; cervical dysplasia (appointment coming up to  assess) Sexual abuse: No   BOWEL MOVEMENT: Pain with bowel movement: No Type of bowel movement:Type (Bristol Stool Scale) 4-6, Frequency up to 10x/day, and Strain No - tries to take deep breaths instead of straining; she will wake up 1-2x/night with severe urgency to have bowel movement Fully empty rectum: No Leakage: Yes: with severe urgency, often after meals Pads: No Fiber supplement: No   URINATION: Pain with urination: No Fully empty bladder: No Stream: Strong Urgency: No Frequency: 4-5x/day, occasional nocturia Leakage:  None Pads: No   INTERCOURSE: Pain with intercourse: During Penetration and after Ability to have vaginal penetration:  Yes: but painful Climax: low back pain with orgasm Marinoff Scale: 1/3   PREGNANCY: Vaginal deliveries 3 Tearing No     PROLAPSE: None     OBJECTIVE:  02/07/23:   PATIENT SURVEYS:    PFIQ-7 62   COGNITION: Overall cognitive status: Within functional limits for tasks assessed                          SENSATION: Light touch: Appears intact Proprioception: Appears intact   FUNCTIONAL TESTS:    GAIT: Comments: WNL   POSTURE: posterior pelvic tilt     PALPATION:   General  tenderness over coccyx, lateral sacral/coccyx borders, and throughout bil posterolateral hip muscles                 External Perineal Exam dryness/pallor                             Internal Pelvic Floor dryness vaginally; pain rectally with inability to progress full finger into anal canal/rectum due to high tone/scar tissue    Patient confirms identification and approves PT to assess internal pelvic floor and treatment Yes vaginal and rectal exam   PELVIC MMT:   MMT eval  Vaginal 2/5, poor coordination, no ability  to hold, decreased coordination with repeat contractions (5x)  Internal Anal Sphincter 1/5  External Anal Sphincter 2/5  Puborectalis 2/5  Diastasis Recti Mild distortion, no separation, core weakness  (Blank rows = not  tested)         TONE: Increased rectally   PROLAPSE: None palpated/visualized in evaluation today in supine   TODAY'S TREATMENT:                                                                                                                              DATE:  04/08/23 Neuromuscular re-education: Bridge with adduction 10x Supine leg extensions 10x bil Supine dead bug 2 x 10 Quadruped Bear crawl plank 2 x 5 Fire hydrant 10x bil Bird dog 2 x 10 Side plank clam shell 10x bil   03/25/23 Neuromuscular re-education: Transversus abdominus training with multimodal cues for improved motor control and breath coordination Supine UE ball press 2 x 10 Hooklying hip adduction with core contraction Side lying UE ball press 2 x 10 bil Supine march 2 x 10 Bridge with hip adduction 2 x 10   03/04/23 Manual: Bowel mobilization Myofascial release to lumbar paraspinals, obliques, glutes, base of sacrum Neuromuscular re-education: Pelvic floor bulge training on swiss ball Pelvic tilts on swiss ball Exercises: Seated forward fold 15 breaths Seated lateral flexion 15 breaths bil    PATIENT EDUCATION:  Education details: see above Person educated: Patient Education method: Explanation, Demonstration, Actor cues, Verbal cues, and Handouts Education comprehension: verbalized understanding   HOME EXERCISE PROGRAM: [id number]   ASSESSMENT:   CLINICAL IMPRESSION: Pt is doing very well with no episodes of fecal incontinence. She was able to progress table exercises to more difficult activities for core and hip strengthening. She continues to have difficulty with breath holding; with multimodal cues she made good improvements throughout the session. HEP updated. She will benefit from skilled PT intervention in order to decrease fecal incontinence, decrease low back/coccyx pain, and address impairments.    OBJECTIVE IMPAIRMENTS: decreased activity tolerance, decreased coordination, decreased  endurance, decreased strength, increased fascial restrictions, increased muscle spasms, postural dysfunction, and pain.    ACTIVITY LIMITATIONS: lifting, bending, standing, sleeping, and continence   PARTICIPATION LIMITATIONS: interpersonal relationship, community activity, and occupation   PERSONAL FACTORS: 1 comorbidity: hx colorectal cancer with radiation/chemo/surgical intervention  are also affecting patient's functional outcome.    REHAB POTENTIAL: Good   CLINICAL DECISION MAKING: Stable/uncomplicated   EVALUATION COMPLEXITY: Low     GOALS: Goals reviewed with patient? Yes   SHORT TERM GOALS: Target date: 03/14/23 - updated 03/25/23   Pt will be independent with HEP.    Baseline: Goal status: MET 03/25/23   2.  Pt will be independent with diaphragmatic breathing and down training activities in order to improve pelvic floor relaxation.   Baseline:  Goal status: MET 03/25/23   3.  Pt will be independent with use of squatty potty, relaxed toileting mechanics, and improved bowel movement techniques  in order to increase ease of bowel movements and complete evacuation.    Baseline:  Goal status: MET 03/25/23     LONG TERM GOALS: Target date: 07/25/23 - updated 03/25/23   Pt will be independent with advanced HEP.    Baseline:  Goal status: IN PROGRESS   2.  Pt will demonstrate normal pelvic floor muscle tone and A/ROM, able to achieve 4/5 strength with contractions and 10 sec endurance, in order to provide appropriate lumbopelvic support in functional activities.    Baseline:  Goal status: IN PROGRESS   3.  Pt will report ability to stand for >60 minutes without increase in pain. Baseline:  Goal status: IN PROGRESS   4.  Pt will report no pain greater than 4/10. Baseline:  Goal status: IN PROGRESS   5.  Pt will report no episodes of fecal incontinence in order to improve confidence in community activities and personal hygiene.    Baseline:  Goal status: IN  PROGRESS   6.  Pt will report no greater than 2-3 bowel movements a day with complete emptying, no episodes of incontinence in between, and decreased urgency.  Baseline:  Goal status: IN PROGRESS   PLAN:   PT FREQUENCY: 1-2x/week   PT DURATION: 6 months   PLANNED INTERVENTIONS: Therapeutic exercises, Therapeutic activity, Neuromuscular re-education, Balance training, Gait training, Patient/Family education, Self Care, Joint mobilization, Dry Needling, Biofeedback, and Manual therapy   PLAN FOR NEXT SESSION:  Progress core/hip strengthening to seated and standing positions.   Julio Alm, PT, DPT05/13/243:30 PM

## 2023-04-15 ENCOUNTER — Ambulatory Visit: Payer: Medicaid Other

## 2023-04-23 ENCOUNTER — Ambulatory Visit: Payer: Medicaid Other

## 2023-04-29 ENCOUNTER — Ambulatory Visit: Payer: Medicaid Other | Attending: Surgical Oncology

## 2023-04-29 DIAGNOSIS — R102 Pelvic and perineal pain: Secondary | ICD-10-CM | POA: Diagnosis present

## 2023-04-29 DIAGNOSIS — M6281 Muscle weakness (generalized): Secondary | ICD-10-CM | POA: Diagnosis present

## 2023-04-29 DIAGNOSIS — R293 Abnormal posture: Secondary | ICD-10-CM | POA: Insufficient documentation

## 2023-04-29 DIAGNOSIS — R279 Unspecified lack of coordination: Secondary | ICD-10-CM | POA: Insufficient documentation

## 2023-04-29 DIAGNOSIS — M5459 Other low back pain: Secondary | ICD-10-CM | POA: Diagnosis present

## 2023-04-29 DIAGNOSIS — M62838 Other muscle spasm: Secondary | ICD-10-CM | POA: Insufficient documentation

## 2023-04-29 DIAGNOSIS — R159 Full incontinence of feces: Secondary | ICD-10-CM | POA: Diagnosis present

## 2023-04-29 DIAGNOSIS — R152 Fecal urgency: Secondary | ICD-10-CM | POA: Diagnosis present

## 2023-04-29 NOTE — Therapy (Signed)
OUTPATIENT PHYSICAL THERAPY TREATMENT NOTE   Patient Name: Sarah [name] MRN: [id number] DOB:1971-03-03, 52 y.o., female Today's Date: 04/29/2023  PCP: Truett Perna, MD REFERRING PROVIDER: Glennis Brink, Scheryl Marten, MD  END OF SESSION:   PT End of Session - 04/29/23 1447     Visit Number 6    Date for PT Re-Evaluation 07/25/23    Authorization Type [employer] Medicare    PT Start Time 1445    PT Stop Time 1525    PT Time Calculation (min) 40 min    Activity Tolerance Patient tolerated treatment well    Behavior During Therapy [facility] for tasks assessed/performed              Past Medical History:  Diagnosis Date   Anxiety    not on meds (11/15/2021)   Cancer (HCC) 2020   rectal cancer-chemo, surgery   Depression    not on meds (11/15/2021)   Family history of bladder cancer    GERD (gastroesophageal reflux disease)    on meds PRN   Past Surgical History:  Procedure Laterality Date   COLON SURGERY  2020   rectal cancer surgery    COLONOSCOPY  06/25/2019   Cirigliano    COLONOSCOPY  2021   VC-MAC-2 day suprep (good)-int hems/HPP x 1-TA x 1;   POLYPECTOMY  2021   HPP x 1, TA x 1   TUBAL LIGATION  2003   Patient Active Problem List   Diagnosis Date Noted   Genetic testing 03/01/2020   Family history of bladder cancer    Leukopenia due to antineoplastic chemotherapy (HCC) 08/12/2019   Tobacco abuse 07/22/2019   History of ETOH abuse 07/22/2019   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    REFERRING DIAG: C20 (ICD-10-CM) - Rectal cancer (HCC)  THERAPY DIAG:  Incontinence of feces with fecal urgency  Full incontinence of feces  Pelvic pain  Other low back pain  Abnormal posture  Muscle weakness (generalized)  Other muscle spasm  Unspecified lack of coordination  Rationale for Evaluation and Treatment Rehabilitation  PERTINENT HISTORY: Colorectal cancer in 2020 with radiation, chemo, and surgery; cervical dysplasia (appointment coming up to  assess)  PRECAUTIONS: hx of cancer  SUBJECTIVE:                                                                                                                                                                                      SUBJECTIVE STATEMENT:  Pt states that she has had more episodes of fecal incontinence over the last couple of weeks, but she has not been able to do many exercises and has missed several PT sessions.    PAIN:  Are you having pain? Yes NPRS scale: 0/10 Pain location:  low back/pelvic pain   Pain type: dull Pain description: constant    Aggravating factors: standing Relieving factors: bath, aleve  02/07/23 SUBJECTIVE STATEMENT: Pt states that she feels like after she eats she has episodes where she doesn't make it to the bathroom. Symptoms are a little inconsistent and don't always happen right after she eats. When she had colorectal cancer, she had chemo pill and radiation. She did have surgery to remove tumor in colon and rectum. She did not have to have a colostomy bag. She is not currently undergoing any cancer treatments, just regular follow ups.  Fluid intake: Yes: 48oz water, 3 12oz cans      PAIN:  Are you having pain? Yes NPRS scale: 8/10 Pain location:  low back/pelvic pain   Pain type: dull Pain description: constant    Aggravating factors: standing Relieving factors: bath, aleve   PRECAUTIONS: None   WEIGHT BEARING RESTRICTIONS: No   FALLS:  Has patient fallen in last 6 months? No   LIVING ENVIRONMENT: Lives with: lives with their family Lives in: House/apartment     OCCUPATION: hair stylist    PLOF: Independent   PATIENT GOALS: get bowels under control   PERTINENT HISTORY:  Colorectal cancer in 2020 with radiation, chemo, and surgery; cervical dysplasia (appointment coming up to assess) Sexual abuse: No   BOWEL MOVEMENT: Pain with bowel movement: No Type of bowel movement:Type (Bristol Stool Scale) 4-6, Frequency up to  10x/day, and Strain No - tries to take deep breaths instead of straining; she will wake up 1-2x/night with severe urgency to have bowel movement Fully empty rectum: No Leakage: Yes: with severe urgency, often after meals Pads: No Fiber supplement: No   URINATION: Pain with urination: No Fully empty bladder: No Stream: Strong Urgency: No Frequency: 4-5x/day, occasional nocturia Leakage:  None Pads: No   INTERCOURSE: Pain with intercourse: During Penetration and after Ability to have vaginal penetration:  Yes: but painful Climax: low back pain with orgasm Marinoff Scale: 1/3   PREGNANCY: Vaginal deliveries 3 Tearing No     PROLAPSE: None     OBJECTIVE:  02/07/23:   PATIENT SURVEYS:    PFIQ-7 62   COGNITION: Overall cognitive status: Within functional limits for tasks assessed                          SENSATION: Light touch: Appears intact Proprioception: Appears intact   FUNCTIONAL TESTS:    GAIT: Comments: WNL   POSTURE: posterior pelvic tilt     PALPATION:   General  tenderness over coccyx, lateral sacral/coccyx borders, and throughout bil posterolateral hip muscles                 External Perineal Exam dryness/pallor                             Internal Pelvic Floor dryness vaginally; pain rectally with inability to progress full finger into anal canal/rectum due to high tone/scar tissue    Patient confirms identification and approves PT to assess internal pelvic floor and treatment Yes vaginal and rectal exam   PELVIC MMT:   MMT eval  Vaginal 2/5, poor coordination, no ability to hold, decreased coordination with repeat contractions (5x)  Internal Anal Sphincter 1/5  External Anal Sphincter 2/5  Puborectalis 2/5  Diastasis Recti Mild distortion, no separation, core  weakness  (Blank rows = not tested)         TONE: Increased rectally   PROLAPSE: None palpated/visualized in evaluation today in supine   TODAY'S TREATMENT:                                                                                                                               DATE:  Neuromuscular re-education: Bridge with adduction 10x Supine leg extensions 10x bil Supine dead bug 2 x 10 Quadruped Bear crawl plank 2 x 5 Fire hydrant 10x bil Bird dog 2 x 10 Side plank clam shell 10x bil   04/08/23 Neuromuscular re-education: Bridge with adduction 10x Supine leg extensions 10x bil Supine dead bug 2 x 10 Quadruped Bear crawl plank 2 x 5 Fire hydrant 10x bil Bird dog 2 x 10 Side plank clam shell 10x bil   03/25/23 Neuromuscular re-education: Transversus abdominus training with multimodal cues for improved motor control and breath coordination Supine UE ball press 2 x 10 Hooklying hip adduction with core contraction Side lying UE ball press 2 x 10 bil Supine march 2 x 10 Bridge with hip adduction 2 x 10    PATIENT EDUCATION:  Education details: see above Person educated: Patient Education method: Programmer, multimedia, Demonstration, Actor cues, Verbal cues, and Handouts Education comprehension: verbalized understanding   HOME EXERCISE PROGRAM: [id number]   ASSESSMENT:   CLINICAL IMPRESSION: Believe pt has seen a mild relapse of symptoms due to not being consistent with HEP and being able to attend PT. She did well with review of previous progressions and focused on lumbopelvic core control and breath coordination. We will focus on addition of new exercises in seated and standing next session before D/C. She will benefit from skilled PT intervention in order to decrease fecal incontinence, decrease low back/coccyx pain, and address impairments.    OBJECTIVE IMPAIRMENTS: decreased activity tolerance, decreased coordination, decreased endurance, decreased strength, increased fascial restrictions, increased muscle spasms, postural dysfunction, and pain.    ACTIVITY LIMITATIONS: lifting, bending, standing, sleeping, and continence   PARTICIPATION  LIMITATIONS: interpersonal relationship, community activity, and occupation   PERSONAL FACTORS: 1 comorbidity: hx colorectal cancer with radiation/chemo/surgical intervention  are also affecting patient's functional outcome.    REHAB POTENTIAL: Good   CLINICAL DECISION MAKING: Stable/uncomplicated   EVALUATION COMPLEXITY: Low     GOALS: Goals reviewed with patient? Yes   SHORT TERM GOALS: Target date: 03/14/23 - updated 03/25/23 - updated 04/29/23   Pt will be independent with HEP.    Baseline: Goal status: MET 03/25/23   2.  Pt will be independent with diaphragmatic breathing and down training activities in order to improve pelvic floor relaxation.   Baseline:  Goal status: MET 03/25/23   3.  Pt will be independent with use of squatty potty, relaxed toileting mechanics, and improved bowel movement techniques in order to increase ease of bowel movements and complete evacuation.    Baseline:  Goal status: MET  03/25/23     LONG TERM GOALS: Target date: 07/25/23 - updated 03/25/23 - updated 04/29/23   Pt will be independent with advanced HEP.    Baseline:  Goal status: IN PROGRESS   2.  Pt will demonstrate normal pelvic floor muscle tone and A/ROM, able to achieve 4/5 strength with contractions and 10 sec endurance, in order to provide appropriate lumbopelvic support in functional activities.    Baseline:  Goal status: IN PROGRESS   3.  Pt will report ability to stand for >60 minutes without increase in pain. Baseline:  Goal status: IN PROGRESS   4.  Pt will report no pain greater than 4/10. Baseline:  Goal status: IN PROGRESS   5.  Pt will report no episodes of fecal incontinence in order to improve confidence in community activities and personal hygiene.    Baseline:  Goal status: IN PROGRESS   6.  Pt will report no greater than 2-3 bowel movements a day with complete emptying, no episodes of incontinence in between, and decreased urgency.  Baseline:  Goal status: IN  PROGRESS   PLAN:   PT FREQUENCY: 1-2x/week   PT DURATION: 6 months   PLANNED INTERVENTIONS: Therapeutic exercises, Therapeutic activity, Neuromuscular re-education, Balance training, Gait training, Patient/Family education, Self Care, Joint mobilization, Dry Needling, Biofeedback, and Manual therapy   PLAN FOR NEXT SESSION:  Seated/standing exercises; D/C   Julio Alm, PT, DPT06/03/243:30 PM

## 2023-05-06 ENCOUNTER — Ambulatory Visit: Payer: Medicaid Other

## 2023-05-06 DIAGNOSIS — R293 Abnormal posture: Secondary | ICD-10-CM

## 2023-05-06 DIAGNOSIS — M6281 Muscle weakness (generalized): Secondary | ICD-10-CM

## 2023-05-06 DIAGNOSIS — M5459 Other low back pain: Secondary | ICD-10-CM

## 2023-05-06 DIAGNOSIS — R102 Pelvic and perineal pain: Secondary | ICD-10-CM

## 2023-05-06 DIAGNOSIS — R279 Unspecified lack of coordination: Secondary | ICD-10-CM

## 2023-05-06 DIAGNOSIS — M62838 Other muscle spasm: Secondary | ICD-10-CM

## 2023-05-06 DIAGNOSIS — R159 Full incontinence of feces: Secondary | ICD-10-CM

## 2023-05-06 NOTE — Therapy (Addendum)
OUTPATIENT PHYSICAL THERAPY TREATMENT NOTE   Patient Name: Sarah [name] MRN: [id number] DOB:[birth year], 52 y.o., female Today's Date: 05/06/2023  PCP: Truett Perna, MD REFERRING PROVIDER: Glennis Brink, Scheryl Marten, MD  END OF SESSION:   PT End of Session - 05/06/23 1445     Visit Number 7    Date for PT Re-Evaluation 07/25/23    Authorization Type [employer] Medicare    PT Start Time 1445    PT Stop Time 1525    PT Time Calculation (min) 40 min    Activity Tolerance Patient tolerated treatment well    Behavior During Therapy [facility] for tasks assessed/performed               Past Medical History:  Diagnosis Date   Anxiety    not on meds (11/15/2021)   Cancer (HCC) 2020   rectal cancer-chemo, surgery   Depression    not on meds (11/15/2021)   Family history of bladder cancer    GERD (gastroesophageal reflux disease)    on meds PRN   Past Surgical History:  Procedure Laterality Date   COLON SURGERY  2020   rectal cancer surgery    COLONOSCOPY  06/25/2019   Cirigliano    COLONOSCOPY  2021   VC-MAC-2 day suprep (good)-int hems/HPP x 1-TA x 1;   POLYPECTOMY  2021   HPP x 1, TA x 1   TUBAL LIGATION  2003   Patient Active Problem List   Diagnosis Date Noted   Genetic testing 03/01/2020   Family history of bladder cancer    Leukopenia due to antineoplastic chemotherapy (HCC) 08/12/2019   Tobacco abuse 07/22/2019   History of ETOH abuse 07/22/2019   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    REFERRING DIAG: C20 (ICD-10-CM) - Rectal cancer (HCC)  THERAPY DIAG:  Incontinence of feces with fecal urgency  Full incontinence of feces  Pelvic pain  Other low back pain  Abnormal posture  Muscle weakness (generalized)  Other muscle spasm  Unspecified lack of coordination  Rationale for Evaluation and Treatment Rehabilitation  PERTINENT HISTORY: Colorectal cancer in 2020 with radiation, chemo, and surgery; cervical dysplasia (appointment coming up to  assess)  PRECAUTIONS: hx of cancer  SUBJECTIVE:                                                                                                                                                                                      SUBJECTIVE STATEMENT: Pt has not had any fecal urgency and is only having 2 bowel movements a day. She has not had any pelvic or low back pain.    PAIN:  Are you having pain? No  02/07/23 SUBJECTIVE STATEMENT: Pt states that she feels like after she eats she has episodes where she doesn't make it to the bathroom. Symptoms are a little inconsistent and don't always happen right after she eats. When she had colorectal cancer, she had chemo pill and radiation. She did have surgery to remove tumor in colon and rectum. She did not have to have a colostomy bag. She is not currently undergoing any cancer treatments, just regular follow ups.  Fluid intake: Yes: 48oz water, 3 12oz cans      PAIN:  Are you having pain? Yes NPRS scale: 8/10 Pain location:  low back/pelvic pain   Pain type: dull Pain description: constant    Aggravating factors: standing Relieving factors: bath, aleve   PRECAUTIONS: None   WEIGHT BEARING RESTRICTIONS: No   FALLS:  Has patient fallen in last 6 months? No   LIVING ENVIRONMENT: Lives with: lives with their family Lives in: House/apartment     OCCUPATION: hair stylist    PLOF: Independent   PATIENT GOALS: get bowels under control   PERTINENT HISTORY:  Colorectal cancer in 2020 with radiation, chemo, and surgery; cervical dysplasia (appointment coming up to assess) Sexual abuse: No   BOWEL MOVEMENT: Pain with bowel movement: No Type of bowel movement:Type (Bristol Stool Scale) 4-6, Frequency up to 10x/day, and Strain No - tries to take deep breaths instead of straining; she will wake up 1-2x/night with severe urgency to have bowel movement Fully empty rectum: No Leakage: Yes: with severe urgency, often after meals Pads:  No Fiber supplement: No   URINATION: Pain with urination: No Fully empty bladder: No Stream: Strong Urgency: No Frequency: 4-5x/day, occasional nocturia Leakage:  None Pads: No   INTERCOURSE: Pain with intercourse: During Penetration and after Ability to have vaginal penetration:  Yes: but painful Climax: low back pain with orgasm Marinoff Scale: 1/3   PREGNANCY: Vaginal deliveries 3 Tearing No     PROLAPSE: None     OBJECTIVE:  02/07/23:   PATIENT SURVEYS:    PFIQ-7 62   COGNITION: Overall cognitive status: Within functional limits for tasks assessed                          SENSATION: Light touch: Appears intact Proprioception: Appears intact   FUNCTIONAL TESTS:    GAIT: Comments: WNL   POSTURE: posterior pelvic tilt     PALPATION:   General  tenderness over coccyx, lateral sacral/coccyx borders, and throughout bil posterolateral hip muscles                 External Perineal Exam dryness/pallor                             Internal Pelvic Floor dryness vaginally; pain rectally with inability to progress full finger into anal canal/rectum due to high tone/scar tissue    Patient confirms identification and approves PT to assess internal pelvic floor and treatment Yes vaginal and rectal exam   PELVIC MMT:   MMT eval  Vaginal 2/5, poor coordination, no ability to hold, decreased coordination with repeat contractions (5x)  Internal Anal Sphincter 1/5  External Anal Sphincter 2/5  Puborectalis 2/5  Diastasis Recti Mild distortion, no separation, core weakness  (Blank rows = not tested)         TONE: Increased rectally   PROLAPSE: None palpated/visualized in evaluation today in supine   TODAY'S TREATMENT:  DATE:  05/06/23 Neuromuscular re-education: Standing march 2 x 10 Standing march with anterior weight hold Standing  chop 5lb weight, 10x bil  Therapeutic activities: Overhead press 2 x 10 bil 5lbs 3-way kick 10xea, bil Standing UE extension 2 x 10 green band Pallof press 10x bil green band  Heel raises 2 x 10 Squats to chair 2 x 10 5lbs   04/29/23 Neuromuscular re-education: Bridge with adduction 10x Supine leg extensions 10x bil Supine dead bug 2 x 10 Quadruped Bear crawl plank 2 x 5 Fire hydrant 10x bil Bird dog 2 x 10 Side plank clam shell 10x bil   04/08/23 Neuromuscular re-education: Bridge with adduction 10x Supine leg extensions 10x bil Supine dead bug 2 x 10 Quadruped Bear crawl plank 2 x 5 Fire hydrant 10x bil Bird dog 2 x 10 Side plank clam shell 10x bil    PATIENT EDUCATION:  Education details: see above Person educated: Patient Education method: Programmer, multimedia, Demonstration, Tactile cues, Verbal cues, and Handouts Education comprehension: verbalized understanding   HOME EXERCISE PROGRAM: [id number]   ASSESSMENT:   CLINICAL IMPRESSION: Pt has done very well in skilled PT intervention and is not having any fecal urgency and is only having 2 bowel movements a day (compared to up to 10 when she first came in). She has not had any low back or pelvic pain. She has been able to progress through functional core/pelvic floor strengthening program and we have developed HEP exercise library so she has variety of exercises to chose from. We progressed into standing strengthening exercises this treatment session with very good tolerance. Due to progress and having met all goals, she is prepared to D/C skilled PT intervention.    OBJECTIVE IMPAIRMENTS: decreased activity tolerance, decreased coordination, decreased endurance, decreased strength, increased fascial restrictions, increased muscle spasms, postural dysfunction, and pain.    ACTIVITY LIMITATIONS: lifting, bending, standing, sleeping, and continence   PARTICIPATION LIMITATIONS: interpersonal relationship, community activity,  and occupation   PERSONAL FACTORS: 1 comorbidity: hx colorectal cancer with radiation/chemo/surgical intervention  are also affecting patient's functional outcome.    REHAB POTENTIAL: Good   CLINICAL DECISION MAKING: Stable/uncomplicated   EVALUATION COMPLEXITY: Low     GOALS: Goals reviewed with patient? Yes   SHORT TERM GOALS: Target date: 03/14/23 - updated 03/25/23 - updated 04/29/23 - updated 05/06/23   Pt will be independent with HEP.    Baseline: Goal status: MET 03/25/23   2.  Pt will be independent with diaphragmatic breathing and down training activities in order to improve pelvic floor relaxation.   Baseline:  Goal status: MET 03/25/23   3.  Pt will be independent with use of squatty potty, relaxed toileting mechanics, and improved bowel movement techniques in order to increase ease of bowel movements and complete evacuation.    Baseline:  Goal status: MET 03/25/23     LONG TERM GOALS: Target date: 07/25/23 - updated 03/25/23 - updated 04/29/23 - updated 05/06/23   Pt will be independent with advanced HEP.    Baseline:  Goal status: Met 05/06/23   2.  Pt will demonstrate normal pelvic floor muscle tone and A/ROM, able to achieve 4/5 strength with contractions and 10 sec endurance, in order to provide appropriate lumbopelvic support in functional activities.    Baseline:  Goal status: Discharged due to no formal pelvic floor reassessment   3.  Pt will report ability to stand for >60 minutes without increase in pain. Baseline:  Goal status: MET 05/06/23   4.  Pt will report no pain greater than 4/10. Baseline:  Goal status: MET 05/06/23   5.  Pt will report no episodes of fecal incontinence in order to improve confidence in community activities and personal hygiene.    Baseline:  Goal status: MET 05/06/23   6.  Pt will report no greater than 2-3 bowel movements a day with complete emptying, no episodes of incontinence in between, and decreased urgency.  Baseline:   Goal status: MET 05/06/23   PLAN:   PT FREQUENCY: -   PT DURATION: -   PLANNED INTERVENTIONS: -   PLAN FOR NEXT SESSION:  D/C  Julio Alm, PT, DPT06/10/242:46 PM  PHYSICAL THERAPY DISCHARGE SUMMARY  Visits from Start of Care: 7  Current functional level related to goals / functional outcomes: Independent   Remaining deficits: See above   Education / Equipment: HEP   Patient agrees to discharge. Patient goals were met. Patient is being discharged due to not returning since the last visit.  Julio Alm, PT, DPT02/04/259:08 AM

## 2023-07-12 ENCOUNTER — Emergency Department (HOSPITAL_COMMUNITY): Payer: Medicaid Other

## 2023-07-12 ENCOUNTER — Other Ambulatory Visit: Payer: Self-pay

## 2023-07-12 ENCOUNTER — Emergency Department (HOSPITAL_BASED_OUTPATIENT_CLINIC_OR_DEPARTMENT_OTHER)
Admission: EM | Admit: 2023-07-12 | Discharge: 2023-07-12 | Disposition: A | Discharge status: Home / Self Care | Payer: Medicaid Other | Attending: Emergency Medicine | Admitting: Emergency Medicine

## 2023-07-12 DIAGNOSIS — Z85048 Personal history of other malignant neoplasm of rectum, rectosigmoid junction, and anus: Secondary | ICD-10-CM | POA: Diagnosis not present

## 2023-07-12 DIAGNOSIS — R202 Paresthesia of skin: Secondary | ICD-10-CM | POA: Diagnosis present

## 2023-07-12 LAB — CBC WITH DIFFERENTIAL/PLATELET
Abs Immature Granulocytes: 0.01 10*3/uL (ref 0.00–0.07)
Basophils Absolute: 0 10*3/uL (ref 0.0–0.1)
Basophils Relative: 1 %
Eosinophils Absolute: 0.1 10*3/uL (ref 0.0–0.5)
Eosinophils Relative: 2 %
HCT: 39.7 % (ref 36.0–46.0)
Hemoglobin: 13.1 g/dL (ref 12.0–15.0)
Immature Granulocytes: 0 %
Lymphocytes Relative: 30 %
Lymphs Abs: 1.4 10*3/uL (ref 0.7–4.0)
MCH: 32 pg (ref 26.0–34.0)
MCHC: 33 g/dL (ref 30.0–36.0)
MCV: 97.1 fL (ref 80.0–100.0)
Monocytes Absolute: 0.4 10*3/uL (ref 0.1–1.0)
Monocytes Relative: 8 %
Neutro Abs: 2.8 10*3/uL (ref 1.7–7.7)
Neutrophils Relative %: 59 %
Platelets: 209 10*3/uL (ref 150–400)
RBC: 4.09 MIL/uL (ref 3.87–5.11)
RDW: 12.2 % (ref 11.5–15.5)
WBC: 4.8 10*3/uL (ref 4.0–10.5)
nRBC: 0 % (ref 0.0–0.2)

## 2023-07-12 LAB — BASIC METABOLIC PANEL
Anion gap: 13 (ref 5–15)
BUN: 11 mg/dL (ref 6–20)
CO2: 21 mmol/L — ABNORMAL LOW (ref 22–32)
Calcium: 7.8 mg/dL — ABNORMAL LOW (ref 8.9–10.3)
Chloride: 108 mmol/L (ref 98–111)
Creatinine, Ser: 0.72 mg/dL (ref 0.44–1.00)
GFR, Estimated: >60 mL/min (ref >60)
Glucose, Bld: 75 mg/dL (ref 70–99)
Potassium: 3.3 mmol/L — ABNORMAL LOW (ref 3.5–5.1)
Sodium: 142 mmol/L (ref 135–145)

## 2023-07-12 LAB — MAGNESIUM: Magnesium: 1.8 mg/dL (ref 1.7–2.4)

## 2023-07-12 MED ORDER — ONDANSETRON HCL 4 MG/2ML IJ SOLN
INTRAMUSCULAR | Status: AC
  Filled 2023-07-12: qty 2

## 2023-07-12 MED ORDER — HYDROMORPHONE HCL 1 MG/ML IJ SOLN
INTRAMUSCULAR | Status: AC
  Filled 2023-07-12: qty 1

## 2023-07-12 NOTE — ED Triage Notes (Signed)
Patient presents to ED via POV from home. Here with right arm tingling/numbness that she noticed at 0300 this morning when she woke up. Patient went to bed last night at 2300. Denies any other symptoms. GCS 15.

## 2023-07-12 NOTE — ED Notes (Signed)
Discharge instructions reviewed with pt. Pt verbalized understanding and had no questions. Pt a&ox4 and in no acute distress at time of discharge.

## 2023-07-12 NOTE — ED Provider Notes (Signed)
Care assumed as patient send transfer for MRI.  In summation 52 year old here for evaluation of right wrist drop which began sometime yesterday evening.  Per supervisor spoke with neurology who recommended MRI brain and cervical spine.  If negative would plan on splinting and following up outpatient with neurology  Labs personally viewed and interpreted:  BMP potassium 3.3, calcium 7.8  Care transferred to oncoming provider who will follow-up on MRI findings  No chest pain, shortness of breath, back pain to suggest dissection.  Physical Exam  BP 120/70   Pulse 84   Temp 98.8 F (37.1 C) (Oral)   Resp 20   SpO2 99%   Physical Exam Vitals and nursing note reviewed.  Constitutional:      General: She is not in acute distress.    Appearance: She is well-developed. She is not ill-appearing, toxic-appearing or diaphoretic.  HENT:     Head: Atraumatic.  Eyes:     Pupils: Pupils are equal, round, and reactive to light.  Cardiovascular:     Rate and Rhythm: Normal rate.  Pulmonary:     Effort: No respiratory distress.  Abdominal:     General: There is no distension.  Musculoskeletal:        General: Normal range of motion.     Cervical back: Normal range of motion.  Skin:    General: Skin is warm and dry.  Neurological:     Mental Status: She is alert.     Comments: Wrist drop at right wrist.  Decreased sensation elbow distally.  Decreased grip strength at right hand No facial droop, cranial nerves II through XII grossly intact Equal strength intact sensation of bilateral lower extremities  Psychiatric:        Mood and Affect: Mood normal.     Procedures  Procedures Labs Reviewed  BASIC METABOLIC PANEL - Abnormal; Notable for the following components:      Result Value   Potassium 3.3 (*)    CO2 21 (*)    Calcium 7.8 (*)    All other components within normal limits  CBC WITH DIFFERENTIAL/PLATELET  MAGNESIUM   No results found.  ED Course / MDM   Clinical Course  as of 07/12/23 1519  Fri Jul 12, 2023  1050 Consult with Neurologist, Dr. Amada Jupiter who recommends [SB]  1052 Discussed with patient plans for MRI as per Neurologist. Pt agreeable at this time. Pt calling her daughter for a ride to the ED.  [SB]  1102 Discussion with Dr. Jearld Fenton at [facility] ED who accepts the patient in transfer.  [SB]    Clinical Course User Index [SB] Blue, Soijett A, PA-C   Care assumed as patient send transfer for MRI.  In summation 52 year old here for evaluation of right wrist drop which began sometime yesterday evening.  Per supervisor spoke with neurology who recommended MRI brain and cervical spine.  If negative would plan on splinting and following up outpatient with neurology  Labs personally viewed interpreted  Care transferred to oncoming provider who will follow-up on MRI findings and dispo appropriately.    Medical Decision Making Amount and/or Complexity of Data Reviewed External Data Reviewed: labs, radiology and notes. Labs: ordered. Decision-making details documented in ED Course. Radiology: ordered and independent interpretation performed. Decision-making details documented in ED Course.          Morayo Leven A, PA-C 07/12/23 1519    Loetta Rough, MD 07/12/23 [phone]

## 2023-07-12 NOTE — ED Provider Notes (Signed)
  Physical Exam  BP 128/86 (BP Location: Left Arm)   Pulse 78   Temp 98.7 F (37.1 C)   Resp 16   SpO2 99%   Physical Exam Vitals and nursing note reviewed.  Constitutional:      General: She is not in acute distress.    Appearance: She is well-developed.  HENT:     Head: Normocephalic and atraumatic.  Eyes:     Conjunctiva/sclera: Conjunctivae normal.  Cardiovascular:     Rate and Rhythm: Normal rate and regular rhythm.     Heart sounds: No murmur heard. Pulmonary:     Effort: Pulmonary effort is normal. No respiratory distress.     Breath sounds: Normal breath sounds.  Abdominal:     Palpations: Abdomen is soft.     Tenderness: There is no abdominal tenderness.  Musculoskeletal:        General: No swelling.     Cervical back: Neck supple.  Skin:    General: Skin is warm and dry.  Neurological:     Mental Status: She is alert.     Motor: Weakness present.     Comments: Unable to fully extend right wrist  Psychiatric:        Mood and Affect: Mood normal.     Procedures  Procedures  ED Course / MDM   Clinical Course as of 07/12/23 1918  Fri Jul 12, 2023  1050 Consult with Neurologist, Dr. Amada Jupiter who recommends [SB]  1052 Discussed with patient plans for MRI as per Neurologist. Pt agreeable at this time. Pt calling her daughter for a ride to the ED.  [SB]  1102 Discussion with Dr. Jearld Fenton at [facility] ED who accepts the patient in transfer.  [SB]  1521 Transfer from OSH for R wrist drop. Neuro rec MRI. MRI not read. If MRI negative, splint and dc.  [WL]    Clinical Course User Index [SB] Blue, Soijett A, PA-C [WL] Dyanne Iha, MD   Medical Decision Making Amount and/or Complexity of Data Reviewed Labs: ordered. Radiology: ordered.   Upon reassessment, patient resting comfortably with no new symptoms.  Remains with right arm wrist drop.  MRI resulted with no acute abnormalities.  Patient was placed in wrist brace and instructed to follow-up with  primary care provider.  Strict return precautions voiced pertaining to lack of resolution of symptoms, new onset numbness, tingling, weakness or other neurologic symptoms.  Voiced agreement and understanding with this plan.  Deemed safe and stable for discharge at this time.       Dyanne Iha, MD 07/12/23 1918    Lonell Grandchild, MD 07/12/23 [phone]

## 2023-07-12 NOTE — ED Provider Notes (Cosign Needed Addendum)
[facility] Provider Note   CSN: [id number] Arrival date & time: 07/12/23  1007     History  Chief Complaint  Patient presents with   Tingling    Sarah [name] is a 52 y.o. female with a PMHx of rectal adenocarcinoma who presents to the ED With concerns for RUE tingling onset 3 AM. She went to bed at 11 PM and woke up at 3 AM and noticed her symptoms. Notes that she was playing with a tire with her dog yesterday prior to the onset of her symptoms. Denies chest pain, shortness of breath, nausea, vomiting, tingling/numbness to remainder of extremities, headache, blurred vision, diplopia. No thinners.   The history is provided by the patient. No language interpreter was used.       Home Medications Prior to Admission medications   Medication Sig Start Date End Date Taking? Authorizing Provider  amoxicillin-clavulanate (AUGMENTIN) 875-125 MG tablet Take 1 tablet by mouth every 12 (twelve) hours. Patient not taking: Reported on 02/07/2023 02/27/22   Prosperi, Christian H, PA-C  CALCIUM PO Take 1 tablet by mouth daily at 6 (six) AM. Patient not taking: Reported on 02/07/2023    [provider]  naproxen sodium (ALEVE) 220 MG tablet Take 220 mg by mouth daily as needed.    [provider]  omeprazole (PRILOSEC) 20 MG capsule Take 20 mg by mouth 2 (two) times daily as needed. Patient not taking: Reported on 02/07/2023    [provider]  ondansetron (ZOFRAN) 4 MG tablet Take 1 tablet (4 mg total) by mouth every 6 (six) hours. Patient not taking: Reported on 02/07/2023 02/27/22   Prosperi, Christian H, PA-C      Allergies    Augmentin [amoxicillin-pot clavulanate] and Escitalopram    Review of Systems   Review of Systems  All other systems reviewed and are negative.   Physical Exam Updated Vital Signs BP (!) 140/98   Pulse 93   Temp 98.8 F (37.1 C) (Oral)   Resp 18   SpO2 94%  Physical Exam Vitals and nursing  note reviewed.  Constitutional:      General: She is not in acute distress.    Appearance: Normal appearance.  Eyes:     General: No scleral icterus.    Extraocular Movements: Extraocular movements intact.  Cardiovascular:     Rate and Rhythm: Normal rate and regular rhythm.     Pulses: Normal pulses.     Heart sounds: Normal heart sounds.  Pulmonary:     Effort: Pulmonary effort is normal. No respiratory distress.     Breath sounds: Normal breath sounds.  Abdominal:     Palpations: Abdomen is soft. There is no mass.     Tenderness: There is no abdominal tenderness.  Musculoskeletal:        General: Normal range of motion.     Cervical back: Neck supple.     Comments: wrist drop noted to right wrist.  Unable to extend right wrist.  Radial pulse intact.  Grip strength decreased on right side.  Normal range of motion of right elbow and shoulder.  Patient able to hold her right arm up without assistance or difficulty.  No tenderness to palpation noted to spine or musculature of back.  Strength sensation intact to bilateral lower extremities.   Skin:    General: Skin is warm and dry.     Findings: No rash.  Neurological:     Mental Status: She  is alert.     Sensory: Sensation is intact.     Motor: Motor function is intact.  Psychiatric:        Behavior: Behavior normal.     ED Results / Procedures / Treatments   Labs (all labs ordered are listed, but only abnormal results are displayed) Labs Reviewed - No data to display  EKG None  Radiology No results found.  Procedures Procedures    Medications Ordered in ED Medications  HYDROmorphone (DILAUDID) 1 MG/ML injection (  Return to [facility] 07/12/23 1137)  Patient did not receive Dilaudid or Zofran in the ED prior to transport to [facility] for MRI brain/cervical spine.  Review of this order it was a cabinet override by the nursing staff for another patient.  ED Course/ Medical Decision Making/ A&P Clinical Course as of  07/12/23 1631  Fri Jul 12, 2023  1050 Consult with Neurologist, Dr. Amada Jupiter who recommends [SB]  1052 Discussed with patient plans for MRI as per Neurologist. Pt agreeable at this time. Pt calling her daughter for a ride to the ED.  [SB]  1102 Discussion with Dr. Jearld Fenton at [facility] ED who accepts the patient in transfer.  [SB]  1521 Transfer from OSH for R wrist drop. Neuro rec MRI. MRI not read. If MRI negative, splint and dc.  [WL]    Clinical Course User Index [SB] Ragen Laver A, PA-C [WL] Dyanne Iha, MD                                 Medical Decision Making Amount and/or Complexity of Data Reviewed Labs: ordered. Radiology: ordered.   Pt presents with tingling/numbness to the right lower portion of the upper extremity onset 3 AM.  No thinners.  No history of similar symptoms. Vital signs, patient afebrile. On exam, pt with wrist drop noted to right wrist.  Unable to extend right wrist.  Radial pulse intact.  Grip strength decreased on right side.  Normal range of motion of right elbow and shoulder.  No tenderness to palpation noted to spine or musculature of back.  Strength sensation intact to bilateral lower extremities. No acute cardiovascular, respiratory, abdominal exam findings. Differential diagnosis includes Saturday night palsy, CVA, TIA, cervical radiculopathy.   Consultations: I requested consultation with the Neurologist, Dr. Amada Jupiter and discussed lab and imaging findings as well as pertinent plan - they recommend: MRI brain and cervical spine.  Disposition: Presentation suspicious for tingling and wrist drop of the right upper extremity. After consideration of the diagnostic results and the patients response to treatment, I feel that the patient would benefit from Transfer to [facility] ED for MRI brain and MRI cervical spine.  If MRI overall reassuring, patient can likely place splinted with outpatient neurology referral.  Discussed with patient plans for  transfer for MRI, patient agreeable this time.  Patient will go POV with family driving her.  Patient appears safe for transfer at this time.   This chart was dictated using voice recognition software, Dragon. Despite the best efforts of this provider to proofread and correct errors, errors may still occur which can change documentation meaning.   Final Clinical Impression(s) / ED Diagnoses Final diagnoses:  Tingling    Rx / DC Orders ED Discharge Orders     None         Bonnie Roig A, PA-C 07/12/23 1215    Estellar Cadena A, PA-C 07/12/23 1632  Maia Plan, MD 07/15/23 [phone]

## 2023-07-12 NOTE — Discharge Instructions (Addendum)
You were seen today for inability to move your right wrist. While you were here we monitored your vitals, preformed a physical exam, and took blood work and a brain MRI. These were all reassuring and there is no indication for any further testing or intervention in the emergency department at this time.   Things to do:  - Follow up with your primary care provider within the next 1-2 weeks -Keep the wrist splint on until your movement deficits improve  Return to the emergency department if you have any new or worsening symptoms including slurred speech, weakness, numbness, trouble swallowing, instability when walking, dizziness, or if you have any other concerns.

## 2023-07-12 NOTE — ED Notes (Signed)
Pt is going POV to [facility] ED Dr. Jearld Fenton is accepting

## 2023-09-07 ENCOUNTER — Other Ambulatory Visit: Payer: Self-pay

## 2023-09-12 ENCOUNTER — Encounter: Payer: Self-pay | Admitting: Neurology

## 2023-09-13 ENCOUNTER — Other Ambulatory Visit: Payer: Self-pay

## 2023-10-14 ENCOUNTER — Encounter: Payer: Self-pay | Admitting: Neurology

## 2023-10-14 ENCOUNTER — Ambulatory Visit: Payer: Medicaid Other | Admitting: Neurology

## 2023-10-14 VITALS — BP 148/84 | HR 82 | Ht 64.0 in | Wt 99.0 lb

## 2023-10-14 DIAGNOSIS — G5630 Lesion of radial nerve, unspecified upper limb: Secondary | ICD-10-CM | POA: Diagnosis not present

## 2023-10-14 NOTE — Patient Instructions (Signed)
Nerve testing of the right arm  ELECTROMYOGRAM AND NERVE CONDUCTION STUDIES (EMG/NCS) INSTRUCTIONS  How to Prepare The neurologist conducting the EMG will need to know if you have certain medical conditions. Tell the neurologist and other EMG lab personnel if you: Have a pacemaker or any other electrical medical device Take blood-thinning medications Have hemophilia, a blood-clotting disorder that causes prolonged bleeding Bathing Take a shower or bath shortly before your exam in order to remove oils from your skin. Don't apply lotions or creams before the exam.  What to Expect You'll likely be asked to change into a hospital gown for the procedure and lie down on an examination table. The following explanations can help you understand what will happen during the exam.  Electrodes. The neurologist or a technician places surface electrodes at various locations on your skin depending on where you're experiencing symptoms. Or the neurologist may insert needle electrodes at different sites depending on your symptoms.  Sensations. The electrodes will at times transmit a tiny electrical current that you may feel as a twinge or spasm. The needle electrode may cause discomfort or pain that usually ends shortly after the needle is removed. If you are concerned about discomfort or pain, you may want to talk to the neurologist about taking a short break during the exam.  Instructions. During the needle EMG, the neurologist will assess whether there is any spontaneous electrical activity when the muscle is at rest - activity that isn't present in healthy muscle tissue - and the degree of activity when you slightly contract the muscle.  He or she will give you instructions on resting and contracting a muscle at appropriate times. Depending on what muscles and nerves the neurologist is examining, he or she may ask you to change positions during the exam.  After your EMG You may experience some temporary, minor  bruising where the needle electrode was inserted into your muscle. This bruising should fade within several days. If it persists, contact your primary care doctor.

## 2023-10-14 NOTE — Progress Notes (Signed)
[facility] Clinic Note - Initial Visit   Date: 10/14/2023   Sarah [name] MRN: [id number] DOB: Aug 03, 1971   Dear Cruz Condon, PA-C:  Thank you for your kind referral of Sarah [name] for consultation of right arm numbness. Although her history is well known to you, please allow [place] to reiterate it for the purpose of our medical record. The patient was accompanied to the clinic by self.    Sarah [name] is a 52 y.o. right-handed female with history of rectal adenocarcinoma and GERD presenting for evaluation of right radial nerve palsy.   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 Motor strength has significant improved with trace weakness with finger extension only.  She is concerned about the numbness in the arm.  I do not see evidence of overlapping radiculopathy. NCS/EMG of the right arm will be ordered to guide prognosis.  I explained that sensory improvement takes longer than motor deficits.    Further recommendations pending results.   ------------------------------------------------------------- History of present illness: She went to the ER in August after waking up with right wrist drop.  MRI brain and cervical spine was unremarkable.  She was diagnosed with radial nerve palsy and referred to OT as well as use a wrist brace.  She completed a few sessions of occupational therapy and reports seeing improvement over 8 weeks.  She continues to have some weakness in the right side, but has largely regained her strength. She complains of numbness from right shoulder down into the hand, which is constant.  Nothing which exacerbates or alleviates.   She works as a Scientist, research (medical).  She lives alone, daughter is in college.  Nonsmoker.  She drinks several drinks per week.    Out-side paper records, electronic medical record, and images have been reviewed where available and summarized as:  MRI brain and cervical  spine wo contrast 07/11/2013: 1. No acute intracranial abnormality. 2. No acute abnormality in the cervical spine. 3. Multilevel degenerative changes of the cervical spine as described above.    Past Medical History:  Diagnosis Date   Anxiety    not on meds (11/15/2021)   Cancer (HCC) 2020   rectal cancer-chemo, surgery   Depression    not on meds (11/15/2021)   Family history of bladder cancer    GERD (gastroesophageal reflux disease)    on meds PRN    Past Surgical History:  Procedure Laterality Date   COLON SURGERY  2020   rectal cancer surgery    COLONOSCOPY  06/25/2019   Cirigliano    COLONOSCOPY  2021   VC-MAC-2 day suprep (good)-int hems/HPP x 1-TA x 1;   POLYPECTOMY  2021   HPP x 1, TA x 1   TUBAL LIGATION  2003     Medications:  Outpatient Encounter Medications as of 10/14/2023  Medication Sig   alendronate (FOSAMAX) 70 MG tablet Take by mouth. Take one tablet once a week   Ergocalciferol (VITAMIN D2 PO) Take by mouth. One tablet once a week   amoxicillin-clavulanate (AUGMENTIN) 875-125 MG tablet Take 1 tablet by mouth every 12 (twelve) hours. (Patient not taking: Reported on 02/07/2023)   CALCIUM PO Take 1 tablet by mouth daily at 6 (six) AM. (Patient not taking: Reported on 02/07/2023)   naproxen sodium (ALEVE) 220 MG tablet Take 220 mg by mouth daily as needed. (Patient not taking: Reported on 10/14/2023)   omeprazole (PRILOSEC) 20 MG capsule Take 20 mg  by mouth 2 (two) times daily as needed. (Patient not taking: Reported on 02/07/2023)   ondansetron (ZOFRAN) 4 MG tablet Take 1 tablet (4 mg total) by mouth every 6 (six) hours. (Patient not taking: Reported on 02/07/2023)   No facility-administered encounter medications on file as of 10/14/2023.    Allergies:  Allergies  Allergen Reactions   Augmentin [Amoxicillin-Pot Clavulanate] Nausea And Vomiting   Escitalopram Other (See Comments)     nervous, diarrhea and loss of appetite.     Family  History: Family History  Problem Relation Age of Onset   Colon polyps Father 49   Bladder Cancer Maternal Grandfather    Colon cancer Neg Hx    Esophageal cancer Neg Hx    Rectal cancer Neg Hx    Stomach cancer Neg Hx     Social History: Social History   Tobacco Use   Smoking status: Former    Current packs/day: 1.00    Types: Cigarettes   Smokeless tobacco: Never  Vaping Use   Vaping status: Never Used  Substance Use Topics   Alcohol use: Yes    Alcohol/week: 3.0 standard drinks of alcohol    Types: 3 Standard drinks or equivalent per week    Comment: occasional   Drug use: No   Social History   Social History Narrative   Are you right handed or left handed? Right Handed   Are you currently employed ? 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 Yes   Who lives with you?    What type of home do you live in: 1 story or 2 story? One story home a basement         Vital Signs:  BP (!) 148/84   Pulse 82   Ht 5\' 4"  (1.626 m)   Wt 99 lb (44.9 kg)   SpO2 98%   BMI 16.99 kg/m   Neurological Exam: MENTAL STATUS including orientation to time, place, person, recent and remote memory, attention span and concentration, language, and fund of knowledge is normal.  Speech is not dysarthric.  CRANIAL NERVES: II:  No visual field defects.     III-IV-VI: Pupils equal round and reactive to light.  Normal conjugate, extra-ocular eye movements in all directions of gaze.  No nystagmus.  No ptosis.   V:  Normal facial sensation.    VII:  Normal facial symmetry and movements.   VIII:  Normal hearing and vestibular function.   IX-X:  Normal palatal movement.   XI:  Normal shoulder shrug and head rotation.   XII:  Normal tongue strength and range of motion, no deviation or fasciculation.  MOTOR:  No atrophy, fasciculations or abnormal movements.  No pronator drift.   Upper Extremity:  Right  Left  Deltoid  5/5   5/5   Biceps  5/5   5/5    Triceps  5/5   5/5   Wrist extensors  5/5   5/5   Wrist flexors  5/5   5/5   Finger extensors  5-/5   5/5   Finger flexors  5/5   5/5   Dorsal interossei  5/5   5/5   Abductor pollicis  5/5   5/5   Tone (Ashworth scale)  0  0   Lower Extremity:  Right  Left  Hip flexors  5/5   5/5   Knee flexors  5/5   5/5   Knee extensors  5/5   5/5   Dorsiflexors  5/5   5/5   Plantarflexors  5/5   5/5   Toe extensors  5/5   5/5   Toe flexors  5/5   5/5   Tone (Ashworth scale)  0  0   MSRs:                                           Right        Left brachioradialis 2+  2+  biceps 2+  2+  triceps 2+  2+  patellar 2+  2+  ankle jerk 2+  2+  Hoffman no  no  plantar response down  down   SENSORY:  Normal and symmetric perception of light touch, pinprick, vibration, and temperature.    COORDINATION/GAIT: Normal finger-to- nose-finger.  Intact rapid alternating movements bilaterally.   Gait narrow based and stable. Tandem and stressed gait intact.     Thank you for allowing me to participate in patient's care.  If I can answer any additional questions, I would be pleased to do so.    Sincerely,    Talon Regala K. Allena Katz, DO

## 2023-10-15 ENCOUNTER — Other Ambulatory Visit: Payer: Self-pay

## 2023-11-28 ENCOUNTER — Encounter: Payer: Self-pay | Admitting: Neurology

## 2023-11-28 ENCOUNTER — Encounter: Payer: Medicaid Other | Admitting: Neurology

## 2023-12-03 ENCOUNTER — Other Ambulatory Visit: Payer: Self-pay

## 2023-12-07 ENCOUNTER — Other Ambulatory Visit: Payer: Self-pay

## 2023-12-11 ENCOUNTER — Ambulatory Visit: Payer: Medicaid Other | Admitting: Gastroenterology

## 2023-12-11 ENCOUNTER — Encounter: Payer: Self-pay | Admitting: Gastroenterology

## 2023-12-11 DIAGNOSIS — K5900 Constipation, unspecified: Secondary | ICD-10-CM

## 2023-12-11 DIAGNOSIS — Z85038 Personal history of other malignant neoplasm of large intestine: Secondary | ICD-10-CM

## 2023-12-11 DIAGNOSIS — K648 Other hemorrhoids: Secondary | ICD-10-CM

## 2023-12-11 DIAGNOSIS — R195 Other fecal abnormalities: Secondary | ICD-10-CM

## 2023-12-11 MED ORDER — NA SULFATE-K SULFATE-MG SULF 17.5-3.13-1.6 GM/177ML PO SOLN: 1.0000 | ORAL | 0 refills | Status: DC

## 2023-12-11 NOTE — Progress Notes (Signed)
Chief Complaint:    Rectal cancer, hematochezia, constipation  GI History: 53 yo female with a history of stage I rectosigmoid cancer (T2N0) diagnosed at the time of index colonoscopy in 05/2019, s/p chemoradiation then LAR 11/24/2019.   - Colonoscopy (05/2019, Dr. Barron Alvine): 6 cm rectosigmoid Adenocarcinoma with oozing, tattoo placed proximal and distal, internal hemorrhoids.  Distal transverse colon was extent reached due to needing smaller caliber endoscope to traverse mass. - Colonsocopy (10/2020): 4 mm ascending polyp (TA), 15 mm descending flat polyp (HP), healthy-appearing anastomosis and proximal rectum.  Grade 2 hemorrhoids.  Repeat 1 year. - Colonoscopy (11/30/2021): 2 small 1-2 mm distal sigmoid hyperplastic polyps located 4-5 cm proximal to the anastomosis, 5 mm sigmoid hyperplastic polyp located 2 cm proximal to the anastomosis, Healthy-appearing anastomosis, small internal hemorrhoids with appropriate hemorrhoid banding scars.  Remainder of the colon normal, normal TI.  Repeat in 3 years.  Completed hemorrhoid banding series in 2022.    HPI:     Patient is a 53 y.o. female presenting to the Gastroenterology Clinic for follow-up.   Was seen in follow-up by Surgical Oncology at [facility] on 12/09/2023.  CEA normal.  - 12/09/2023: CT A/P: Perirectal stranding, similar to prior.  Previous described mild rectal wall thickening is less severe on current examination.  No obstruction.  No adenopathy.  Postradiation changes within the sacrum.   Normal BMP in 08/2023 and normal CBC in 06/2023.  Today, she presents to the office for routine follow-up.  She reports being told by her Surgical Oncologist to have a repeat colonoscopy this year after reviewing CT with her.    Has had irregular bowel habits and bloating. Sxs have been episodic. Not currently taking any stool softeners, laxatives, etc.  Had a single episode of BRB on tissue paper after hard stool last week. Started fiber gummies  today. Started Fosamax 5 weeks ago (3% ADR of constipation). Last BM was 2 days ago. Baseline is 2 formed, soft stools daily.    Review of systems:     No chest pain, no SOB, no fevers, no urinary sx   Past Medical History:  Diagnosis Date   Anxiety    not on meds (11/15/2021)   Cancer (HCC) 2020   rectal cancer-chemo, surgery   Depression    not on meds (11/15/2021)   Family history of bladder cancer    GERD (gastroesophageal reflux disease)    on meds PRN    Patient's surgical history, family medical history, social history, medications and allergies were all reviewed in Epic    Current Outpatient Medications  Medication Sig Dispense Refill   alendronate (FOSAMAX) 70 MG tablet Take by mouth. Take one tablet once a week     FIBER GUMMIES PO Take 1 tablet by mouth daily.     naproxen sodium (ALEVE) 220 MG tablet Take 220 mg by mouth daily as needed.     Vitamin D, Ergocalciferol, (DRISDOL) 1.25 MG (50000 UNIT) CAPS capsule Take 50,000 Units by mouth once a week.     No current facility-administered medications for this visit.    Physical Exam:     BP 102/82   Pulse 88   Ht 5\' 4"  (1.626 m)   Wt 98 lb 8 oz (44.7 kg)   SpO2 96%   BMI 16.91 kg/m   GENERAL:  Pleasant female in NAD PSYCH: : Cooperative, normal affect EENT:  conjunctiva pink, mucous membranes moist, neck supple without masses CARDIAC:  RRR, no murmur heard, no peripheral  edema PULM: Normal respiratory effort, lungs CTA bilaterally, no wheezing ABDOMEN:  Nondistended, soft, nontender. No obvious masses, no hepatomegaly,  normal bowel sounds SKIN:  turgor, no lesions seen Musculoskeletal:  Normal muscle tone, normal strength NEURO: Alert and oriented x 3, no focal neurologic deficits   IMPRESSION and PLAN:    1) History of colon cancer History of stage I rectosigmoid cancer (T2N0) diagnosed at the time of index colonoscopy in 05/2019, s/p chemoradiation then LAR 11/24/2019.  Last colonoscopy was 11/2021  and notable for a few small hyperplastic polyps, but otherwise healthy appearing anastomosis.  CT done earlier this week with perirectal stranding, similar to prior and per patient her Surgical Oncologist wanted repeat colonoscopy this year. - Schedule repeat colonoscopy as requested by her Surgical Oncologist for ongoing surveillance - Add Dulcolax to prep given recent constipation - If repeat colonoscopy unremarkable, can potentially liberalize to Q3 year surveillance plan  2) Change in bowel habits 3) Constipation - Start MiraLAX 1 cap daily - Increase water to 64 oz/day - Resume fiber supplement  4) Internal hemorrhoids Completed hemorrhoid banding series in 2022 with good clinical response.  Rare BRB on tissue paper with constipation/straining to have BM - Evaluate for hemorrhoids at time of colonoscopy as above  The indications, risks, and benefits of colonoscopy were explained to the patient in detail. Risks include but are not limited to bleeding, perforation, adverse reaction to medications, and cardiopulmonary compromise. Sequelae include but are not limited to the possibility of surgery, hospitalization, and mortality. The patient verbalized understanding and wished to proceed. All questions answered, referred to the scheduler and bowel prep ordered. Further recommendations pending results of the exam.        Shellia Cleverly ,DO, FACG 12/11/2023, 10:39 AM

## 2023-12-11 NOTE — Patient Instructions (Addendum)
 _______________________________________________________  If your blood pressure at your visit was 140/90 or greater, please contact your primary care physician to follow up on this.  If you are age 55 or younger, your body mass index should be between 19-25. Your Body mass index is 16.91 kg/m. If this is out of the aformentioned range listed, please consider follow up with your Primary Care Provider.  ________________________________________________________  The [facility] providers would like to encourage you to use MYCHART to communicate with providers for non-urgent requests or questions.  Due to long hold times on the telephone, sending your provider a message by [facility] may be a faster and more efficient way to get a response.  Please allow 48 business hours for a response.  Please remember that this is for non-urgent requests.  _______________________________________________________  Please purchase the following medications over the counter and take as directed:  START: Miralax 1 capful daily  CONTINUE: fiber supplement  **Take Dulcolax 10mg  twice daily for 2 days prior to colonoscopy**  You have been scheduled for a colonoscopy. Please follow written instructions given to you at your visit today.   Please pick up your prep supplies at the pharmacy within the next 1-3 days.  If you use inhalers (even only as needed), please bring them with you on the day of your procedure.  DO NOT TAKE 7 DAYS PRIOR TO TEST- Trulicity (dulaglutide) Ozempic, Wegovy (semaglutide) Mounjaro (tirzepatide) Bydureon Bcise (exanatide extended release)  DO NOT TAKE 1 DAY PRIOR TO YOUR TEST Rybelsus (semaglutide) Adlyxin (lixisenatide) Victoza (liraglutide) Byetta (exanatide) ___________________________________________________________________________  Due to recent changes in healthcare laws, you may see the results of your imaging and laboratory studies on MyChart before your provider has had a  chance to review them.  We understand that in some cases there may be results that are confusing or concerning to you. Not all laboratory results come back in the same time frame and the provider may be waiting for multiple results in order to interpret others.  Please give us  48 hours in order for your provider to thoroughly review all the results before contacting the office for clarification of your results.   It was a pleasure to see you today!  Vito Cirigliano, D.O.

## 2023-12-12 ENCOUNTER — Ambulatory Visit: Payer: Medicaid Other | Admitting: Gastroenterology

## 2023-12-27 ENCOUNTER — Ambulatory Visit: Payer: Medicaid Other | Admitting: Gastroenterology

## 2023-12-27 ENCOUNTER — Encounter: Payer: Self-pay | Admitting: Gastroenterology

## 2023-12-27 VITALS — BP 131/81 | HR 68 | Temp 97.0°F | Resp 13 | Ht 64.0 in | Wt 98.0 lb

## 2023-12-27 DIAGNOSIS — D128 Benign neoplasm of rectum: Secondary | ICD-10-CM

## 2023-12-27 DIAGNOSIS — K635 Polyp of colon: Secondary | ICD-10-CM | POA: Diagnosis not present

## 2023-12-27 DIAGNOSIS — Z1211 Encounter for screening for malignant neoplasm of colon: Secondary | ICD-10-CM

## 2023-12-27 DIAGNOSIS — Z980 Intestinal bypass and anastomosis status: Secondary | ICD-10-CM

## 2023-12-27 DIAGNOSIS — K6289 Other specified diseases of anus and rectum: Secondary | ICD-10-CM | POA: Diagnosis not present

## 2023-12-27 DIAGNOSIS — D125 Benign neoplasm of sigmoid colon: Secondary | ICD-10-CM

## 2023-12-27 DIAGNOSIS — K621 Rectal polyp: Secondary | ICD-10-CM

## 2023-12-27 DIAGNOSIS — K640 First degree hemorrhoids: Secondary | ICD-10-CM

## 2023-12-27 DIAGNOSIS — Z85038 Personal history of other malignant neoplasm of large intestine: Secondary | ICD-10-CM

## 2023-12-27 DIAGNOSIS — K644 Residual hemorrhoidal skin tags: Secondary | ICD-10-CM

## 2023-12-27 DIAGNOSIS — K648 Other hemorrhoids: Secondary | ICD-10-CM

## 2023-12-27 MED ORDER — SODIUM CHLORIDE 0.9 % IV SOLN: 500.0000 mL | INTRAVENOUS | Status: DC

## 2023-12-27 NOTE — Progress Notes (Signed)
GASTROENTEROLOGY PROCEDURE H&P NOTE   Primary Care Physician: Charolett Bumpers, PA-C    Reason for Procedure:  Colon cancer surveillance  Plan:    Colonoscopy  Patient is appropriate for endoscopic procedure(s) in the ambulatory ([facility]) setting.  The nature of the procedure, as well as the risks, benefits, and alternatives were carefully and thoroughly reviewed with the patient. Ample time for discussion and questions allowed. The patient understood, was satisfied, and agreed to proceed.     HPI: Sarah [name] is a 53 y.o. female who presents for colonoscopy for ongoing colon cancer surveillance.  Patient was most recently seen in the Gastroenterology Clinic on 12/11/2023 by me.  Did also endorse constipation at that time, and was started on MiraLAX 1 cap daily and increase water consumption.  Otherwise, no interval change in medical history since that appointment. Please refer to that note for full details regarding GI history and clinical presentation.   History of stage I rectosigmoid cancer (T2N0) diagnosed at the time of index colonoscopy in 05/2019, s/p chemoradiation then LAR 11/24/2019.  Last colonoscopy was 11/2021 and notable for a few small hyperplastic polyps, but otherwise healthy appearing anastomosis.  CT done earlier this month with perirectal stranding, similar to prior and per patient her Surgical Oncologist wanted repeat colonoscopy this year.   Past Medical History:  Diagnosis Date   Anxiety    not on meds (11/15/2021)   Cancer (HCC) 2020   rectal cancer-chemo, surgery   Depression    not on meds (11/15/2021)   Family history of bladder cancer    GERD (gastroesophageal reflux disease)    on meds PRN    Past Surgical History:  Procedure Laterality Date   COLON SURGERY  2020   rectal cancer surgery    COLONOSCOPY  06/25/2019   Evony Rezek    COLONOSCOPY  2021   VC-MAC-2 day suprep (good)-int hems/HPP x 1-TA x 1;   POLYPECTOMY  2021   HPP x 1, TA x 1    TUBAL LIGATION  2003    Prior to Admission medications   Medication Sig Start Date End Date Taking? Authorizing Provider  alendronate (FOSAMAX) 70 MG tablet Take by mouth. Take one tablet once a week 09/16/23   [provider]  FIBER GUMMIES PO Take 1 tablet by mouth daily.    [provider]  Na Sulfate-K Sulfate-Mg Sulfate concentrate (SUPREP BOWEL PREP KIT) 17.5-3.13-1.6 GM/177ML SOLN Take 1 kit by mouth as directed. 12/11/23   Ahmed Inniss V, DO  naproxen sodium (ALEVE) 220 MG tablet Take 220 mg by mouth daily as needed.    [provider]  Vitamin D, Ergocalciferol, (DRISDOL) 1.25 MG (50000 UNIT) CAPS capsule Take 50,000 Units by mouth once a week.    [provider]    Current Outpatient Medications  Medication Sig Dispense Refill   alendronate (FOSAMAX) 70 MG tablet Take by mouth. Take one tablet once a week     FIBER GUMMIES PO Take 1 tablet by mouth daily.     Na Sulfate-K Sulfate-Mg Sulfate concentrate (SUPREP BOWEL PREP KIT) 17.5-3.13-1.6 GM/177ML SOLN Take 1 kit by mouth as directed. 1 each 0   naproxen sodium (ALEVE) 220 MG tablet Take 220 mg by mouth daily as needed.     Vitamin D, Ergocalciferol, (DRISDOL) 1.25 MG (50000 UNIT) CAPS capsule Take 50,000 Units by mouth once a week.     No current facility-administered medications for this visit.    Allergies as of 12/27/2023 -  Review Complete 12/11/2023  Allergen Reaction Noted   Augmentin [amoxicillin-pot clavulanate] Nausea And Vomiting 11/03/2020   Escitalopram Other (See Comments) 05/12/2020    Family History  Problem Relation Age of Onset   Colon polyps Father 73   Bladder Cancer Maternal Grandfather    Colon cancer Neg Hx    Esophageal cancer Neg Hx    Rectal cancer Neg Hx    Stomach cancer Neg Hx    Pancreatic cancer Neg Hx     Social History   Socioeconomic History   Marital status: Single    Spouse name: Not on file   Number of children: Not on file   Years of  education: Not on file   Highest education level: Not on file  Occupational History   Not on file  Tobacco Use   Smoking status: Former    Current packs/day: 1.00    Types: Cigarettes   Smokeless tobacco: Never  Vaping Use   Vaping status: Never Used  Substance and Sexual Activity   Alcohol use: Yes    Alcohol/week: 3.0 standard drinks of alcohol    Types: 3 Standard drinks or equivalent per week    Comment: occasional   Drug use: No   Sexual activity: Not on file  Other Topics Concern   Not on file  Social History Narrative   Are you right handed or left handed? Right Handed   Are you currently employed ? 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 Yes   Who lives with you?    What type of home do you live in: 1 story or 2 story? One story home a basement        Social Drivers of Corporate investment banker Strain: Not on file  Food Insecurity: No Food Insecurity (11/25/2019)   Received from [facility], [facility]   Hunger Vital Sign    Worried About Running Out of Food in the Last Year: Never true    Ran Out of Food in the Last Year: Never true  Transportation Needs: No Transportation Needs (07/23/2019)   PRAPARE - Administrator, Civil Service (Medical): No    Lack of Transportation (Non-Medical): No  Physical Activity: Not on file  Stress: Not on file  Social Connections: Not on file  Intimate Partner Violence: Not on file    Physical Exam: Vital signs in last 24 hours: @There  were no vitals taken for this visit. GEN: NAD EYE: Sclerae anicteric ENT: MMM CV: Non-tachycardic Pulm: CTA b/l GI: Soft, NT/ND NEURO:  Alert & Oriented x 3   Doristine Locks, DO [facility]   12/27/2023 7:32 AM

## 2023-12-27 NOTE — Op Note (Signed)
[facility] Patient Name: Sarah [name] Procedure Date: 12/27/2023 8:23 AM MRN: [id number] Endoscopist: Doristine Locks , MD, [id number] Age: 53 Referring MD:  Date of Birth: 08-22-1971 Gender: Female Account #: [id number] Procedure:                Colonoscopy Indications:              High risk colon cancer surveillance: Personal                            history of colon cancer                           54 yo with a history of stage I rectosigmoid cancer                            (T2N0) diagnosed at the time of index colonoscopy                            in 05/2019, s/p chemoradiation then LAR 11/24/2019.                            Last colonoscopy was 11/2021 and notable for a few                            small hyperplastic polyps, but otherwise healthy                            appearing anastomosis. CT done earlier this month                            with perirectal stranding, similar to prior. Medicines:                Monitored Anesthesia Care Procedure:                Pre-Anesthesia Assessment:                           - Prior to the procedure, a History and Physical                            was performed, and patient medications and                            allergies were reviewed. The patient's tolerance of                            previous anesthesia was also reviewed. The risks                            and benefits of the procedure and the sedation                            options and risks were discussed with the patient.  All questions were answered, and informed consent                            was obtained. Prior Anticoagulants: The patient has                            taken no anticoagulant or antiplatelet agents. ASA                            Grade Assessment: II - A patient with mild systemic                            disease. After reviewing the risks and benefits,                            the patient was  deemed in satisfactory condition to                            undergo the procedure.                           After obtaining informed consent, the colonoscope                            was passed under direct vision. Throughout the                            procedure, the patient's blood pressure, pulse, and                            oxygen saturations were monitored continuously. The                            Olympus Scope [id number] was introduced through the                            anus and advanced to the the terminal ileum. The                            colonoscopy was performed without difficulty. The                            patient tolerated the procedure well. The quality                            of the bowel preparation was good. The terminal                            ileum, ileocecal valve, appendiceal orifice, and                            rectum were photographed. Scope In: 8:35:54 AM Scope Out: 8:55:17 AM Scope Withdrawal Time: 0 hours 9 minutes 23 seconds  Total Procedure Duration:  0 hours 19 minutes 23 seconds  Findings:                 Skin tags were found on perianal exam.                           There was evidence of a prior end-to-end                            colo-colonic anastomosis in the rectum. This was                            patent and was characterized by healthy appearing                            mucosa. The anastomosis was traversed.                           An area of mildly congested mucosa was found in the                            rectum. Biopsies were taken with a cold forceps for                            histology. Estimated blood loss was minimal.                           A 5 mm polyp was found in the distal rectum. The                            polyp was sessile. The polyp was removed with a                            cold snare. Resection and retrieval were complete.                            Estimated blood loss was  minimal.                           Two sessile polyps were found in the sigmoid colon.                            The polyps were 2 to 3 mm in size. These polyps                            were removed with a cold snare. Resection and                            retrieval were complete. Estimated blood loss was                            minimal.  The terminal ileum appeared normal.                           Internal hemorrhoids were found during                            retroflexion. The hemorrhoids were small. Complications:            No immediate complications. Estimated Blood Loss:     Estimated blood loss was minimal. Impression:               - Perianal skin tags found on perianal exam.                           - Patent end-to-end colo-colonic anastomosis,                            characterized by healthy appearing mucosa.                           - Congested mucosa in the rectum. Biopsied.                           - One 5 mm polyp in the distal rectum, removed with                            a cold snare. Resected and retrieved.                           - Two 2 to 3 mm polyps in the sigmoid colon,                            removed with a cold snare. Resected and retrieved.                           - The examined portion of the ileum was normal.                           - Internal hemorrhoids. Recommendation:           - Patient has a contact number available for                            emergencies. The signs and symptoms of potential                            delayed complications were discussed with the                            patient. Return to normal activities tomorrow.                            Written discharge instructions were provided to the  patient.                           - Resume previous diet.                           - Continue present medications.                           - Await pathology  results.                           - Repeat colonoscopy for surveillance based on                            pathology results.                           - Return to GI clinic PRN. Doristine Locks, MD 12/27/2023 9:05:03 AM

## 2023-12-27 NOTE — Progress Notes (Signed)
Pt states no changes to health hx since dr office visit

## 2023-12-27 NOTE — Patient Instructions (Signed)

## 2023-12-27 NOTE — Progress Notes (Signed)
Vss nad trans nto pacu 

## 2023-12-27 NOTE — Progress Notes (Signed)
 Called to room to assist during endoscopic procedure.  Patient ID and intended procedure confirmed with present staff. Received instructions for my participation in the procedure from the performing physician.

## 2023-12-30 ENCOUNTER — Telehealth: Payer: Self-pay

## 2023-12-30 NOTE — Telephone Encounter (Signed)
  Follow up Call-     12/27/2023    8:01 AM 11/30/2021   10:34 AM  Call back number  Post procedure Call Back phone  # [phone]  Permission to leave phone message Yes Yes     Patient questions:  Do you have a fever, pain , or abdominal swelling? No. Pain Score  0 *  Have you tolerated food without any problems? Yes.    Have you been able to return to your normal activities? Yes.    Do you have any questions about your discharge instructions: Diet   No. Medications  No. Follow up visit  No.  Do you have questions or concerns about your Care? No.  Actions: * If pain score is 4 or above: No action needed, pain <4.

## 2023-12-31 LAB — SURGICAL PATHOLOGY

## 2024-01-02 ENCOUNTER — Ambulatory Visit: Payer: Medicaid Other | Admitting: Neurology

## 2024-01-02 ENCOUNTER — Encounter: Payer: Self-pay | Admitting: Gastroenterology

## 2024-01-02 DIAGNOSIS — G5630 Lesion of radial nerve, unspecified upper limb: Secondary | ICD-10-CM

## 2024-01-02 DIAGNOSIS — G5601 Carpal tunnel syndrome, right upper limb: Secondary | ICD-10-CM

## 2024-01-02 NOTE — Procedures (Signed)
 [facility]  [address], [address]  [place], [place] [address] Tel: [phone] Fax: [phone] Test Date:  01/02/2024  Patient: Sarah [name] DOB: 04/07/1971 Physician: Tonita Blanch, DO  Sex: Female Height: 5' 4 Ref Phys: Tonita Blanch, DO  ID#: [id number]   Technician:    History: This is a 53 year old female with history of right wrist drop referred for evaluation of ongoing right hand paresthesias.  NCV & EMG Findings: Extensive electrodiagnostic testing of the right upper extremity shows:  Right median, ulnar, and radial sensory responses are within normal limits.  Right mixed palmar sensory response shows prolonged latency. Right median, ulnar, and radial motor responses are within normal limits. There is no evidence of active or chronic motor axonal loss changes affecting any of the tested muscles.  Motor unit configuration and recruitment pattern is within normal limits.  Impression: Right median neuropathy at or distal to the wrist, consistent with a clinical diagnosis of carpal tunnel syndrome.  Overall, these findings are very mild in degree electrically. There is no evidence of a right radial neuropathy or cervical radiculopathy.   ___________________________ Tonita Blanch, DO    Nerve Conduction Studies   Stim Site NR Peak (ms) Norm Peak (ms) O-P Amp (V) Norm O-P Amp  Right Median Anti Sensory (2nd Digit)  32 C  Wrist    3.4 <3.6 20.2 >15  Left Radial Anti Sensory (Base 1st Digit)  32 C  Wrist    2.2 <2.7 31.5 >14  Right Radial Anti Sensory (Base 1st Digit)  32 C  Wrist    2.2 <2.7 24.3 >14  Right Ulnar Anti Sensory (5th Digit)  32 C  Wrist    2.5 <3.1 19.5 >10     Stim Site NR Onset (ms) Norm Onset (ms) O-P Amp (mV) Norm O-P Amp Site1 Site2 Delta-0 (ms) Dist (cm) Vel (m/s) Norm Vel (m/s)  Right Median Motor (Abd Poll Brev)  32 C  Wrist    3.3 <4.0 8.1 >6 Elbow Wrist 5.0 27.0 54 >50  Elbow    8.3  7.9         Right Radial Motor (Ext Ind  Prop)  32 C  7cm    1.8 <3.1 7.4 >5 ACF 7cm 1.6 8.0 50 >50  ACF    3.4  7.0  Below SG ACF 1.5 8.0 53   Below SG    4.9  7.0  Above SG Below SG 0.4 3.0 75   Above SG    5.3  6.9         Right Ulnar Motor (Abd Dig Minimi)  32 C  Wrist    2.2 <3.1 9.5 >7 B Elbow Wrist 3.3 21.0 64 >50  B Elbow    5.5  9.2  A Elbow B Elbow 1.5 10.0 67 >50  A Elbow    7.0  9.1            Stim Site NR Peak (ms) Norm Peak (ms) P-T Amp (V) Site1 Site2 Delta-P (ms) Norm Delta (ms)  Right Median/Ulnar Palm Comparison (Wrist - 8cm)  32 C  Median Palm    2.0 <2.2 97.5 Median Palm Ulnar Palm *0.7   Ulnar Palm    1.3 <2.2 33.9       Electromyography   Side Muscle Ins.Act Fibs Fasc Recrt Amp Dur Poly Activation Comment  Right 1stDorInt Nml Nml Nml Nml Nml Nml Nml Nml N/A  Right Abd Poll Brev Nml Nml Nml Nml Nml  Nml Nml Nml N/A  Right PronatorTeres Nml Nml Nml Nml Nml Nml Nml Nml N/A  Right Biceps Nml Nml Nml Nml Nml Nml Nml Nml N/A  Right Triceps Nml Nml Nml Nml Nml Nml Nml Nml N/A  Right Deltoid Nml Nml Nml Nml Nml Nml Nml Nml N/A  Right ExtCarRadLong Nml Nml Nml Nml Nml Nml Nml Nml N/A  Right BrachioRad Nml Nml Nml Nml Nml Nml Nml Nml N/A  Right Ext Indicis Nml Nml Nml Nml Nml Nml Nml Nml N/A      Waveforms:

## 2024-01-02 NOTE — Progress Notes (Signed)
    Follow-up Visit   Date: 01/02/2024    Sarah [name] MRN: [id number] DOB: May 22, 1971    Sarah [name] is a 53 y.o. right-handed Caucasian female with  history of rectal adenocarcinoma and GERD returning to the clinic for follow-up of right hand parethesias.  The patient was accompanied to the clinic by self.  IMPRESSION/PLAN: Right carpal tunnel syndrome, very mild.  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  I encouraged her to start using a wrist brace if her hand paresthesias get bothersome.   History of right radial neuropathy, resolved.  NCS/EMG of the right arm does not show any ongoing nerve pathology.  Her radial sensory response is very mildly asymmetric on the right, but remains above normal limits.  Reassurance provided.   Return to clinic as needed  --------------------------------------------- History of present illness: She went to the ER in August after waking up with right wrist drop.  MRI brain and cervical spine was unremarkable.  She was diagnosed with radial nerve palsy and referred to OT as well as use a wrist brace.  She completed a few sessions of occupational therapy and reports seeing improvement over 8 weeks.  She continues to have some weakness in the right side, but has largely regained her strength. She complains of numbness from right shoulder down into the hand, which is constant.  Nothing which exacerbates or alleviates.    She works as a scientist, research (medical).  She lives alone, daughter is in college.  Nonsmoker.  She drinks several drinks per week.    UPDATE 01/02/2024:  She is here for EDX of the hand.  She reports having good strength in the right hand, but continues to have odd sensation in the hand, where she finds herself having to stretch and reposition the hand.  Numbness is not as intense as it was initially.    Medications:  Current Outpatient Medications on File Prior to Visit  Medication Sig Dispense Refill   alendronate (FOSAMAX)  70 MG tablet Take by mouth. Take one tablet once a week     FIBER GUMMIES PO Take 1 tablet by mouth daily.     naproxen sodium (ALEVE) 220 MG tablet Take 220 mg by mouth daily as needed.     Vitamin D, Ergocalciferol, (DRISDOL) 1.25 MG (50000 UNIT) CAPS capsule Take 50,000 Units by mouth once a week.     No current facility-administered medications on file prior to visit.    Allergies:  Allergies  Allergen Reactions   Augmentin  [Amoxicillin -Pot Clavulanate] Nausea And Vomiting   Escitalopram Other (See Comments)     nervous, diarrhea and loss of appetite.     Vital Signs:  There were no vitals taken for this visit.  Neurological exam deferred  Data: NCS/EMG of the right arm 01/02/2024: Right median neuropathy at or distal to the wrist, consistent with a clinical diagnosis of carpal tunnel syndrome.  Overall, these findings are very mild in degree electrically. There is no evidence of a right radial neuropathy or cervical radiculopathy.   Thank you for allowing me to participate in patient's care.  If I can answer any additional questions, I would be pleased to do so.    Sincerely,    Iyonnah Ferrante K. Tobie, DO
